# Patient Record
Sex: Female | Born: 1950
Health system: Southern US, Community
[De-identification: ages and names within clinical notes are randomized; demographics above are authoritative.]

## PROBLEM LIST (undated history)

## (undated) DIAGNOSIS — F419 Anxiety disorder, unspecified: Secondary | ICD-10-CM

## (undated) DIAGNOSIS — I10 Essential (primary) hypertension: Secondary | ICD-10-CM

## (undated) DIAGNOSIS — E119 Type 2 diabetes mellitus without complications: Secondary | ICD-10-CM

## (undated) DIAGNOSIS — L409 Psoriasis, unspecified: Secondary | ICD-10-CM

## (undated) DIAGNOSIS — A630 Anogenital (venereal) warts: Secondary | ICD-10-CM

## (undated) HISTORY — DX: Psoriasis, unspecified: L40.9

## (undated) HISTORY — DX: Type 2 diabetes mellitus without complications: E11.9

## (undated) HISTORY — DX: Essential (primary) hypertension: I10

## (undated) HISTORY — PX: APPENDECTOMY: SHX54

## (undated) HISTORY — DX: Anogenital (venereal) warts: A63.0

## (undated) HISTORY — PX: OPEN REPAIR PERIARTICULAR FRACTURE / DISLOCATION ELBOW: SUR901

## (undated) HISTORY — DX: Anxiety disorder, unspecified: F41.9

## (undated) HISTORY — PX: BREAST SURGERY: SHX581

---

## 1984-03-27 HISTORY — PX: TUBAL LIGATION: SHX77

## 1988-03-27 HISTORY — PX: ABDOMINAL HYSTERECTOMY: SHX81

## 1992-03-27 HISTORY — PX: CERVICAL DISCECTOMY: SHX98

## 1998-08-23 ENCOUNTER — Encounter: Payer: Self-pay | Admitting: Orthopedic Surgery

## 1998-08-27 ENCOUNTER — Inpatient Hospital Stay (HOSPITAL_COMMUNITY): Admission: RE | Admit: 1998-08-27 | Discharge: 1998-08-29 | Payer: Self-pay | Admitting: Orthopedic Surgery

## 1998-08-27 ENCOUNTER — Encounter: Payer: Self-pay | Admitting: Orthopedic Surgery

## 1999-03-28 HISTORY — PX: INCISION / DEBRIDEMENT BONE ELBOW: SUR694

## 1999-12-07 ENCOUNTER — Other Ambulatory Visit: Admission: RE | Admit: 1999-12-07 | Discharge: 1999-12-07 | Payer: Self-pay | Admitting: Family Medicine

## 2000-12-11 ENCOUNTER — Other Ambulatory Visit: Admission: RE | Admit: 2000-12-11 | Discharge: 2000-12-11 | Payer: Self-pay | Admitting: Family Medicine

## 2001-12-25 ENCOUNTER — Other Ambulatory Visit: Admission: RE | Admit: 2001-12-25 | Discharge: 2001-12-25 | Payer: Self-pay | Admitting: Obstetrics and Gynecology

## 2002-09-24 DIAGNOSIS — C4491 Basal cell carcinoma of skin, unspecified: Secondary | ICD-10-CM

## 2002-09-24 HISTORY — DX: Basal cell carcinoma of skin, unspecified: C44.91

## 2002-12-29 ENCOUNTER — Other Ambulatory Visit: Admission: RE | Admit: 2002-12-29 | Discharge: 2002-12-29 | Payer: Self-pay | Admitting: Obstetrics and Gynecology

## 2003-05-20 DIAGNOSIS — D229 Melanocytic nevi, unspecified: Secondary | ICD-10-CM

## 2003-05-20 HISTORY — DX: Melanocytic nevi, unspecified: D22.9

## 2003-09-08 ENCOUNTER — Ambulatory Visit (HOSPITAL_COMMUNITY): Admission: RE | Admit: 2003-09-08 | Discharge: 2003-09-08 | Payer: Self-pay | Admitting: Gastroenterology

## 2003-09-08 ENCOUNTER — Encounter (INDEPENDENT_AMBULATORY_CARE_PROVIDER_SITE_OTHER): Payer: Self-pay | Admitting: Specialist

## 2003-09-15 ENCOUNTER — Encounter: Admission: RE | Admit: 2003-09-15 | Discharge: 2003-09-15 | Payer: Self-pay | Admitting: Gastroenterology

## 2005-04-11 ENCOUNTER — Other Ambulatory Visit: Admission: RE | Admit: 2005-04-11 | Discharge: 2005-04-11 | Payer: Self-pay | Admitting: Obstetrics & Gynecology

## 2006-05-29 ENCOUNTER — Other Ambulatory Visit: Admission: RE | Admit: 2006-05-29 | Discharge: 2006-05-29 | Payer: Self-pay | Admitting: Obstetrics and Gynecology

## 2008-12-07 ENCOUNTER — Encounter: Admission: RE | Admit: 2008-12-07 | Discharge: 2008-12-07 | Payer: Self-pay | Admitting: Gastroenterology

## 2009-02-24 LAB — HM COLONOSCOPY

## 2009-06-21 ENCOUNTER — Encounter: Admission: RE | Admit: 2009-06-21 | Discharge: 2009-06-21 | Payer: Self-pay | Admitting: Obstetrics and Gynecology

## 2009-07-22 ENCOUNTER — Ambulatory Visit (HOSPITAL_COMMUNITY): Admission: RE | Admit: 2009-07-22 | Discharge: 2009-07-22 | Payer: Self-pay | Admitting: Orthopedic Surgery

## 2009-07-25 HISTORY — PX: SHOULDER DEBRIDEMENT: SHX1052

## 2010-04-17 ENCOUNTER — Encounter: Payer: Self-pay | Admitting: Neurological Surgery

## 2010-04-26 LAB — HM DEXA SCAN: HM Dexa Scan: BORDERLINE

## 2010-06-14 LAB — BASIC METABOLIC PANEL
Calcium: 9.5 mg/dL (ref 8.4–10.5)
Chloride: 108 mEq/L (ref 96–112)
GFR calc Af Amer: >60 mL/min (ref >60)
Potassium: 3.7 mEq/L (ref 3.5–5.1)
Sodium: 142 mEq/L (ref 135–145)

## 2010-08-12 NOTE — Op Note (Signed)
NAME:  Jacqueline Cox, Jacqueline Cox [facility]                   ACCOUNT NO.:  [id number]   MEDICAL RECORD NO.:  [id number]                   PATIENT TYPE:  AMB   LOCATION:  ENDO                                 FACILITY:  [facility]   PHYSICIAN:  Petra Kuba, M.D.                 DATE OF BIRTH:  May 22, 1950   DATE OF PROCEDURE:  09/08/2003  DATE OF DISCHARGE:                                 OPERATIVE REPORT   PROCEDURE:  Colonoscopy.   INDICATIONS:  Anemia, due for colon screening.   Consent was signed after the risks, benefits, methods and options were  thoroughly discussed in the office.   MEDICATIONS:  Demerol 80 mg, Versed 8 mg.   DESCRIPTION OF PROCEDURE:  Rectal inspection was pertinent for small  internal hemorrhoids.  Digital rectal exam was negative.  The video  pediatric adjustable colonoscope was inserted and it was advanced to the  level of the proximal hepatic flexure.  Unfortunately, there was significant  looping at this juncture and despite rolling her on her back and rolling her  on her right side and various abdominal pressures, we were unable to advance  any further.  On slow withdrawal, other than some tiny probable hyperplastic-  appearing rectal polyps which were not biopsied, no other abnormalities were  seen.  Anal and rectal pull through on retroflexion confirmed some small  hemorrhoids.  The prep was adequate.  We elected to insert the regular scope  and again it was easily able to be advanced to roughly the same exact area  but again despite altering positions and various abdominal pressures, could  not advance this scope any further due to looping.  The scope was then  slowly withdrawn and again no additional findings were seen.  The air was  suctioned, the scope removed, the patient tolerated the procedure well and  there was no obvious or immediate complication.   ENDOSCOPIC DIAGNOSES:  1. Internal and external hemorrhoids.  2. Few tiny hyperplastic-appearing rectal  polyps not biopsied secondary to     get a barium enema to rule out significant right-sided lesions.  3. Otherwise within normal limits to approximately the hepatic flexure using     both the pediatric adjustable and regular scope, unable to advance     secondary to looping.   PLAN:  Air contrast barium enema and continue workup with an EGD for now.                                               Petra Kuba, M.D.   MEM/MEDQ  D:  09/08/2003  T:  09/08/2003  Job:  [id number]   cc:   Ernestina Penna, M.D.  [address] [place]  [place] [address]  Fax: [phone]

## 2010-08-12 NOTE — Op Note (Signed)
NAME:  Jacqueline Cox, Jacqueline Cox [facility]                   ACCOUNT NO.:  [id number]   MEDICAL RECORD NO.:  [id number]                   PATIENT TYPE:  AMB   LOCATION:  ENDO                                 FACILITY:  [facility]   PHYSICIAN:  Petra Kuba, M.D.                 DATE OF BIRTH:  1950/07/12   DATE OF PROCEDURE:  09/08/2003  DATE OF DISCHARGE:                                 OPERATIVE REPORT   PROCEDURE:  Esophagogastroduodenoscopy.   INDICATIONS FOR PROCEDURE:  Longstanding upper tract symptoms, some upper  abdominal pain radiating to her back.   Consent was signed after risks, benefits, methods, and options were  thoroughly discussed in the office.   ADDITIONAL MEDICINES FOR THIS PROCEDURE:  Demerol 20, Versed 2.   DESCRIPTION OF PROCEDURE:  The video endoscope was inserted by direct  vision.  The esophagus was normal. In the distal esophagus was a small to  medium sized hiatal hernia with a widely patent ring. There was no signs of  Barrett's or significant esophagitis.  The scope passed into the stomach and  advanced through a normal antrum and normal pylorus into the duodenal bulb  pertinent for some minimal bulbitis and around the C loop to a normal second  portion of the duodenum. The scope was slowly withdrawn back to the bulb and  a good look there ruled out abnormalities in that location except for some  minimal inflammation. The scope was withdrawn back to the stomach and  retroflexed.  High in the cardia, the hiatal hernia was confirmed.  The  fundus, angularis, lesser and greater curve were normal except for some  minimal inflammation. Straight visualization confirmed the minimal  gastritis, ruled out any additional findings.  Two biopsies of the antrum  through the proximal stomach were obtained to rule out helicobacter, air was  suctioned, scope slowly withdrawn. Again a good look at the hiatal hernia  pouch and the esophagus confirmed the above findings. The scope was  removed.  The patient tolerated the procedure well. There was no obvious or immediate  complications.   ENDOSCOPIC DIAGNOSIS:  1. Small hiatal hernia with a widely patent proximal ring.  2. Minimal gastritis, antritis status post biopsy, rule out helicobacter.  3. Minimal bulbitis.  4. Otherwise normal esophagogastroduodenoscopy.   PLAN:  Await pathology, continue pump inhibitors.  Okay to use b.i.d. on a  bad day and get an ultrasound next to make sure gallbladder is not playing a  role with her symptoms.                                               Petra Kuba, M.D.    MEM/MEDQ  D:  09/08/2003  T:  09/08/2003  Job:  [id number]   cc:   Ernestina Penna, M.D.  [address] [place]  [place] [address]  Fax: [phone]

## 2011-06-20 ENCOUNTER — Other Ambulatory Visit: Payer: Self-pay | Admitting: Physician Assistant

## 2011-06-20 DIAGNOSIS — C4491 Basal cell carcinoma of skin, unspecified: Secondary | ICD-10-CM

## 2011-06-20 HISTORY — DX: Basal cell carcinoma of skin, unspecified: C44.91

## 2011-07-26 DIAGNOSIS — E119 Type 2 diabetes mellitus without complications: Secondary | ICD-10-CM

## 2011-07-26 HISTORY — DX: Type 2 diabetes mellitus without complications: E11.9

## 2011-08-15 LAB — HM MAMMOGRAPHY: HM Mammogram: NORMAL

## 2012-06-11 DIAGNOSIS — I10 Essential (primary) hypertension: Secondary | ICD-10-CM | POA: Insufficient documentation

## 2012-06-11 DIAGNOSIS — E785 Hyperlipidemia, unspecified: Secondary | ICD-10-CM | POA: Insufficient documentation

## 2012-06-11 DIAGNOSIS — E1169 Type 2 diabetes mellitus with other specified complication: Secondary | ICD-10-CM | POA: Insufficient documentation

## 2012-06-11 DIAGNOSIS — E119 Type 2 diabetes mellitus without complications: Secondary | ICD-10-CM | POA: Insufficient documentation

## 2012-07-16 ENCOUNTER — Ambulatory Visit: Payer: Self-pay | Admitting: Nurse Practitioner

## 2012-07-16 ENCOUNTER — Encounter: Payer: Self-pay | Admitting: *Deleted

## 2012-07-18 ENCOUNTER — Encounter: Payer: Self-pay | Admitting: Nurse Practitioner

## 2012-07-18 ENCOUNTER — Ambulatory Visit (INDEPENDENT_AMBULATORY_CARE_PROVIDER_SITE_OTHER): Payer: BC Managed Care – PPO | Admitting: Nurse Practitioner

## 2012-07-18 VITALS — BP 126/72 | HR 76 | Ht 65.0 in | Wt 152.4 lb

## 2012-07-18 DIAGNOSIS — Z01419 Encounter for gynecological examination (general) (routine) without abnormal findings: Secondary | ICD-10-CM

## 2012-07-18 DIAGNOSIS — Z0000 Encounter for general adult medical examination without abnormal findings: Secondary | ICD-10-CM

## 2012-07-18 LAB — POCT URINALYSIS DIPSTICK
Urobilinogen, UA: NEGATIVE
pH, UA: 6.5

## 2012-07-18 NOTE — Patient Instructions (Addendum)

## 2012-07-18 NOTE — Progress Notes (Signed)
62 y.o. Married Caucasian Fe here for annual exam.  Some vaso symptoms but are very tolerable. Not sexually active.  Husband with prostate cancer and doing well. About 6 months ago, he also had back surgery and is in PT.   No LMP recorded.          Sexually active: no  The current method of family planning is status post hysterectomy.    Exercising: yes  Home exercise routine includes walking 3 hrs per week. Smoker:  no  Health Maintenance: Pap:  05/29/2006 normal MMG: May 2013 Colonoscopy:  2011 BMD:   2013 normal TDaP:  12/15/2010 Labs: PCP does blood work      Past Medical History  Diagnosis Date  . Anxiety   . Hypertension     Past Surgical History  Procedure Laterality Date  . Abdominal hysterectomy  1990    TAH    Current Outpatient Prescriptions  Medication Sig Dispense Refill  . amLODipine (NORVASC) 5 MG tablet Take 5 mg by mouth daily.      Marland Kitchen aspirin 81 MG tablet Take 81 mg by mouth daily.      Marland Kitchen atorvastatin (LIPITOR) 20 MG tablet Take 20 mg by mouth daily.      . busPIRone (BUSPAR) 7.5 MG tablet Take 7.5 mg by mouth 3 (three) times daily.      . Calcium Carbonate-Vit D-Min (CALCIUM 1200 PO) Take by mouth.      . cetirizine (ZYRTEC) 10 MG tablet Take 10 mg by mouth daily.      . cholecalciferol (VITAMIN D) 1000 UNITS tablet Take 1,000 Units by mouth daily.      . lansoprazole (PREVACID) 30 MG capsule Take 30 mg by mouth daily.      . Multiple Vitamin (MULTIVITAMIN) capsule Take 1 capsule by mouth daily.      Marland Kitchen omeprazole (PRILOSEC) 20 MG capsule Take 20 mg by mouth daily.       No current facility-administered medications for this visit.    Family History  Problem Relation Age of Onset  . Hypertension Mother   . Brain cancer Mother   . Hypertension Father   . Diabetes Father   . Hypertension Sister   . Diabetes Sister   . Hypertension Brother   . Lung cancer Brother   . Diabetes Brother     ROS:  Pertinent items are noted in HPI.  Otherwise, a  comprehensive ROS was negative.  Exam:   There were no vitals taken for this visit.       Ht Readings from Last 3 Encounters:  No data found for Ht    General appearance: alert, cooperative and appears stated age Head: Normocephalic, without obvious abnormality, atraumatic Neck: no adenopathy, supple, symmetrical, trachea midline and thyroid normal to inspection and palpation Lungs: clear to auscultation bilaterally Breasts: normal appearance, no masses or tenderness, positive findings: scar left breast at 3:00 position Heart: regular rate and rhythm Abdomen: soft, non-tender; no masses,  no organomegaly Extremities: extremities normal, atraumatic, no cyanosis or edema Skin: Skin color, texture, turgor normal. No rashes or lesions Lymph nodes: Cervical, supraclavicular, and axillary nodes normal. No abnormal inguinal nodes palpated Neurologic: Grossly normal   Pelvic: External genitalia:  no lesions              Urethra:  normal appearing urethra with no masses, tenderness or lesions              Bartholin's and Skene's: normal  Vagina: normal appearing vagina with normal color and discharge, no lesions              Cervix: absent              Pap taken: no Bimanual Exam:  Uterus:  uterus absent              Adnexa: no mass, fullness, tenderness               Rectovaginal: Confirms               Anus:  normal sphincter tone, no lesions  A:  Well Woman with normal exam  S /P TAH secondary to AUB and abnormal pap 1990 (over 20 yrs)  P:   Mammogram due 07/2012  pap smear not indicated  return annually or prn  An After Visit Summary was printed and given to the patient.

## 2012-07-20 ENCOUNTER — Emergency Department (HOSPITAL_COMMUNITY): Payer: BC Managed Care – PPO

## 2012-07-20 ENCOUNTER — Encounter (HOSPITAL_COMMUNITY): Payer: Self-pay

## 2012-07-20 ENCOUNTER — Emergency Department (HOSPITAL_COMMUNITY)
Admission: EM | Admit: 2012-07-20 | Discharge: 2012-07-21 | Disposition: A | Discharge status: Home / Self Care | Payer: BC Managed Care – PPO | Attending: Emergency Medicine | Admitting: Emergency Medicine

## 2012-07-20 DIAGNOSIS — E119 Type 2 diabetes mellitus without complications: Secondary | ICD-10-CM | POA: Insufficient documentation

## 2012-07-20 DIAGNOSIS — I10 Essential (primary) hypertension: Secondary | ICD-10-CM | POA: Insufficient documentation

## 2012-07-20 DIAGNOSIS — R110 Nausea: Secondary | ICD-10-CM | POA: Insufficient documentation

## 2012-07-20 DIAGNOSIS — Z8709 Personal history of other diseases of the respiratory system: Secondary | ICD-10-CM | POA: Insufficient documentation

## 2012-07-20 DIAGNOSIS — F411 Generalized anxiety disorder: Secondary | ICD-10-CM | POA: Insufficient documentation

## 2012-07-20 DIAGNOSIS — Z79899 Other long term (current) drug therapy: Secondary | ICD-10-CM | POA: Insufficient documentation

## 2012-07-20 DIAGNOSIS — Z7982 Long term (current) use of aspirin: Secondary | ICD-10-CM | POA: Insufficient documentation

## 2012-07-20 DIAGNOSIS — R109 Unspecified abdominal pain: Secondary | ICD-10-CM

## 2012-07-20 DIAGNOSIS — Z872 Personal history of diseases of the skin and subcutaneous tissue: Secondary | ICD-10-CM | POA: Insufficient documentation

## 2012-07-20 LAB — URINALYSIS, ROUTINE W REFLEX MICROSCOPIC
Bilirubin Urine: NEGATIVE
Leukocytes, UA: NEGATIVE
Nitrite: NEGATIVE
Specific Gravity, Urine: 1.017 (ref 1.005–1.030)
Urobilinogen, UA: 0.2 mg/dL (ref 0.0–1.0)
pH: 6 (ref 5.0–8.0)

## 2012-07-20 LAB — COMPREHENSIVE METABOLIC PANEL
ALT: 73 U/L — ABNORMAL HIGH (ref 0–35)
Alkaline Phosphatase: 80 U/L (ref 39–117)
BUN: 12 mg/dL (ref 6–23)
CO2: 24 mEq/L (ref 19–32)
Chloride: 103 mEq/L (ref 96–112)
GFR calc Af Amer: >90 mL/min (ref >90)
Glucose, Bld: 116 mg/dL — ABNORMAL HIGH (ref 70–99)
Potassium: 3.5 mEq/L (ref 3.5–5.1)
Sodium: 138 mEq/L (ref 135–145)
Total Bilirubin: 0.3 mg/dL (ref 0.3–1.2)

## 2012-07-20 LAB — CBC WITH DIFFERENTIAL/PLATELET
HCT: 40.3 % (ref 36.0–46.0)
Lymphs Abs: 1.8 10*3/uL (ref 0.7–4.0)
MCHC: 33 g/dL (ref 30.0–36.0)
MCV: 84.7 fL (ref 78.0–100.0)
Monocytes Relative: 15 % — ABNORMAL HIGH (ref 3–12)
Platelets: 315 10*3/uL (ref 150–400)
RDW: 14.1 % (ref 11.5–15.5)

## 2012-07-20 LAB — URINE MICROSCOPIC-ADD ON

## 2012-07-20 LAB — LIPASE, BLOOD: Lipase: 54 U/L (ref 11–59)

## 2012-07-20 MED ORDER — SODIUM CHLORIDE 0.9 % IV SOLN
INTRAVENOUS | Status: DC
  Administered 2012-07-20: 19:00:00 via INTRAVENOUS

## 2012-07-20 MED ORDER — IOHEXOL 300 MG/ML  SOLN: 100.0000 mL | Freq: Once | INTRAMUSCULAR | Status: AC | PRN

## 2012-07-20 MED ORDER — HYDROMORPHONE HCL PF 1 MG/ML IJ SOLN
0.5000 mg | Freq: Once | INTRAMUSCULAR | Status: AC
  Administered 2012-07-20: 0.5 mg via INTRAVENOUS
  Filled 2012-07-20: qty 1

## 2012-07-20 MED ORDER — IOHEXOL 300 MG/ML  SOLN
50.0000 mL | Freq: Once | INTRAMUSCULAR | Status: AC | PRN
  Administered 2012-07-20: 50 mL via ORAL

## 2012-07-20 MED ORDER — ONDANSETRON HCL 4 MG/2ML IJ SOLN
4.0000 mg | Freq: Once | INTRAMUSCULAR | Status: AC
  Administered 2012-07-20: 4 mg via INTRAVENOUS
  Filled 2012-07-20: qty 2

## 2012-07-20 NOTE — ED Notes (Signed)
She c/o upper abd. Discomfort with nausea and anorexia since this Wed.  She states she had two b.m.'s today; the first was "small" and formed, the second was "a lot of liquid".  She denies seeing any blood in her stool.  She also mentions having some sinus congestion; for which she was seen at an [facility] where she works; and they prescribed penicillin "for my sinuses" yesterday.

## 2012-07-20 NOTE — ED Provider Notes (Signed)
History     CSN: [id number]  Arrival date & time 07/20/12  1734   First MD Initiated Contact with Patient 07/20/12 1739      Chief Complaint  Patient presents with  . Abdominal Pain    (Consider location/radiation/quality/duration/timing/severity/associated sxs/prior treatment) Patient is a 62 y.o. female presenting with abdominal pain. The history is provided by the patient.  Abdominal Pain  patient here complaining of right upper quadrant and epigastric pain since Wednesday. Some nausea but no vomiting or fever prior to arrival. Started on Augmentin for a sinus infection. Symptoms are worse with movement and characterized as sharp and crampy. Did have a loose stool as well but denies any blood in stool. No prior history of same. Questionable association with food. No treatment used for this prior to arrival.  Past Medical History  Diagnosis Date  . Anxiety   . Diabetes mellitus without complication 07/2011    type 2  . Hypertension late 15s  . Psoriasis age 41    hair line, pubic, hands    Past Surgical History  Procedure Laterality Date  . Abdominal hysterectomy  1990    TAH, AUB, ? endometriosis, cervical dysplasia  . Tubal ligation  1986  . Cervical discectomy  1994    C 4-5  . Open repair periarticular fracture / dislocation elbow Right 2000 for 3 surgeries    secondary to a fall  . Incision / debridement bone elbow Right 2001  . Shoulder debridement Left 07/2009    bone spur  . Breast surgery Left 1993 & 1995    benign BX    Family History  Problem Relation Age of Onset  . Hypertension Mother   . Brain cancer Mother 92  . Hypertension Father   . Diabetes Father   . Hypertension Sister   . Diabetes Sister   . Hypertension Brother   . Lung cancer Brother 46  . Diabetes Brother     History  Substance Use Topics  . Smoking status: Never Smoker   . Smokeless tobacco: Never Used  . Alcohol Use: No    OB History   Grav Para Term Preterm Abortions TAB  SAB Ect Mult Living   2 2 2              Review of Systems  Gastrointestinal: Positive for abdominal pain.  All other systems reviewed and are negative.    Allergies  Codeine and Paxil  Home Medications   Current Outpatient Rx  Name  Route  Sig  Dispense  Refill  . amLODipine (NORVASC) 5 MG tablet   Oral   Take 5 mg by mouth daily.         Marland Kitchen aspirin 81 MG tablet   Oral   Take 81 mg by mouth daily.         Marland Kitchen atorvastatin (LIPITOR) 20 MG tablet   Oral   Take 20 mg by mouth daily.         . busPIRone (BUSPAR) 7.5 MG tablet   Oral   Take 7.5 mg by mouth 3 (three) times daily.         . cetirizine (ZYRTEC) 10 MG tablet   Oral   Take 10 mg by mouth daily.         . cholecalciferol (VITAMIN D) 1000 UNITS tablet   Oral   Take 1,000 Units by mouth daily.         . metFORMIN (GLUCOPHAGE) 500 MG tablet   Oral  Take 500 mg by mouth 2 (two) times daily with a meal.         . Multiple Vitamin (MULTIVITAMIN) capsule   Oral   Take 1 capsule by mouth daily.         Marland Kitchen omeprazole (PRILOSEC) 20 MG capsule   Oral   Take 20 mg by mouth daily.           BP 138/80  Temp(Src) 99 F (37.2 C) (Oral)  SpO2 100%  Physical Exam  Nursing note and vitals reviewed. Constitutional: She is oriented to person, place, and time. She appears well-developed and well-nourished.  Non-toxic appearance. No distress.  HENT:  Head: Normocephalic and atraumatic.  Eyes: Conjunctivae, EOM and lids are normal. Pupils are equal, round, and reactive to light.  Neck: Normal range of motion. Neck supple. No tracheal deviation present. No mass present.  Cardiovascular: Normal rate, regular rhythm and normal heart sounds.  Exam reveals no gallop.   No murmur heard. Pulmonary/Chest: Effort normal and breath sounds normal. No stridor. No respiratory distress. She has no decreased breath sounds. She has no wheezes. She has no rhonchi. She has no rales.  Abdominal: Soft. Normal  appearance and bowel sounds are normal. She exhibits no distension. There is tenderness in the right upper quadrant, right lower quadrant and epigastric area. There is no rigidity, no rebound, no guarding and no CVA tenderness.  Musculoskeletal: Normal range of motion. She exhibits no edema and no tenderness.  Neurological: She is alert and oriented to person, place, and time. She has normal strength. No cranial nerve deficit or sensory deficit. GCS eye subscore is 4. GCS verbal subscore is 5. GCS motor subscore is 6.  Skin: Skin is warm and dry. No abrasion and no rash noted.  Psychiatric: She has a normal mood and affect. Her speech is normal and behavior is normal.    ED Course  Procedures (including critical care time)  Labs Reviewed  CBC WITH DIFFERENTIAL  COMPREHENSIVE METABOLIC PANEL  LIPASE, BLOOD  URINALYSIS, ROUTINE W REFLEX MICROSCOPIC   No results found.   No diagnosis found.    MDM  Pt given iv fluids and pain meds, abd [place] neg for stones, will order abd ct, dr. Dierdre Highman to f/u        Toy Baker, MD 07/20/12 2204

## 2012-07-20 NOTE — ED Notes (Signed)
Pt notified that urine is needed

## 2012-07-21 MED ORDER — IOHEXOL 300 MG/ML  SOLN
100.0000 mL | Freq: Once | INTRAMUSCULAR | Status: AC | PRN
  Administered 2012-07-21: 100 mL via INTRAVENOUS

## 2012-07-21 MED ORDER — ONDANSETRON HCL 4 MG PO TABS: 4.0000 mg | ORAL_TABLET | Freq: Four times a day (QID) | ORAL | Status: DC

## 2012-07-21 MED ORDER — HYDROCODONE-ACETAMINOPHEN 5-325 MG PO TABS: 2.0000 | ORAL_TABLET | ORAL | Status: DC | PRN

## 2012-07-21 NOTE — Progress Notes (Signed)
Encounter reviewed by Dr. Yessika Otte Silva.  

## 2012-07-21 NOTE — ED Provider Notes (Signed)
PT evaluated 12:48 AM after IV Dilaudid x 2 and zofran/ IVFs. Ct reviewed as below. On recheck pain improved. No acute ABD. Plan f/u GI. Clear liquids diet 24 hours then bland foods. Return precautions provided   Results for orders placed during the hospital encounter of 07/20/12  CBC WITH DIFFERENTIAL      Result Value Range   WBC 6.0  4.0 - 10.5 K/uL   RBC 4.76  3.87 - 5.11 MIL/uL   Hemoglobin 13.3  12.0 - 15.0 g/dL   HCT 16.1  09.6 - 04.5 %   MCV 84.7  78.0 - 100.0 fL   MCH 27.9  26.0 - 34.0 pg   MCHC 33.0  30.0 - 36.0 g/dL   RDW 40.9  81.1 - 91.4 %   Platelets 315  150 - 400 K/uL   Neutrophils Relative 53  43 - 77 %   Neutro Abs 3.2  1.7 - 7.7 K/uL   Lymphocytes Relative 30  12 - 46 %   Lymphs Abs 1.8  0.7 - 4.0 K/uL   Monocytes Relative 15 (*) 3 - 12 %   Monocytes Absolute 0.9  0.1 - 1.0 K/uL   Eosinophils Relative 1  0 - 5 %   Eosinophils Absolute 0.1  0.0 - 0.7 K/uL   Basophils Relative 0  0 - 1 %   Basophils Absolute 0.0  0.0 - 0.1 K/uL  COMPREHENSIVE METABOLIC PANEL      Result Value Range   Sodium 138  135 - 145 mEq/L   Potassium 3.5  3.5 - 5.1 mEq/L   Chloride 103  96 - 112 mEq/L   CO2 24  19 - 32 mEq/L   Glucose, Bld 116 (*) 70 - 99 mg/dL   BUN 12  6 - 23 mg/dL   Creatinine, Ser 7.82  0.50 - 1.10 mg/dL   Calcium 9.6  8.4 - 95.6 mg/dL   Total Protein 7.5  6.0 - 8.3 g/dL   Albumin 3.9  3.5 - 5.2 g/dL   AST 65 (*) 0 - 37 U/L   ALT 73 (*) 0 - 35 U/L   Alkaline Phosphatase 80  39 - 117 U/L   Total Bilirubin 0.3  0.3 - 1.2 mg/dL   GFR calc non Af Amer >90  >90 mL/min   GFR calc Af Amer >90  >90 mL/min  LIPASE, BLOOD      Result Value Range   Lipase 54  11 - 59 U/L  URINALYSIS, ROUTINE W REFLEX MICROSCOPIC      Result Value Range   Color, Urine YELLOW  YELLOW   APPearance CLEAR  CLEAR   Specific Gravity, Urine 1.017  1.005 - 1.030   pH 6.0  5.0 - 8.0   Glucose, UA NEGATIVE  NEGATIVE mg/dL   Hgb urine dipstick SMALL (*) NEGATIVE   Bilirubin Urine NEGATIVE   NEGATIVE   Ketones, ur NEGATIVE  NEGATIVE mg/dL   Protein, ur NEGATIVE  NEGATIVE mg/dL   Urobilinogen, UA 0.2  0.0 - 1.0 mg/dL   Nitrite NEGATIVE  NEGATIVE   Leukocytes, UA NEGATIVE  NEGATIVE  URINE MICROSCOPIC-ADD ON      Result Value Range   Squamous Epithelial / LPF RARE  RARE   RBC / HPF 0-2  <3 RBC/hpf   Bacteria, UA RARE  RARE   US Abdomen Complete  07/20/2012  *RADIOLOGY REPORT*  Clinical Data:  Right upper quadrant, epigastric, and lower abdominal pain radiating to the back.  Nausea and  vomiting.  COMPLETE ABDOMINAL ULTRASOUND  Comparison:  CT colonography 12/07/2008  Findings:  Gallbladder:  Focal echogenic structure in the wall of the gallbladder with ring down artifact consistent with cholesterolosis.  No evidence of gallbladder wall thickening, edema, stone, or sludge.  Common bile duct:  Normal caliber with diameter measured at 3 mm.  Liver:  No focal lesion identified.  Within normal limits in parenchymal echogenicity.  IVC:  Limited visualization due to overlying bowel gas.  Visualized segments are unremarkable.  Pancreas:  Portions of the head and body of the pancreas are unremarkable.  Limited visualization of detail due to overlying bowel gas.  Spleen:  Spleen length measures 4.5 cm.  Normal parenchymal echotexture.  Right Kidney:  Right kidney measures 11.1 cm length.  No hydronephrosis.  Left Kidney:  Left kidney measures 12.3 cm length.  No hydronephrosis.  Abdominal aorta:  Limited segmental visualization due to overlying bowel gas and visualized segments are not aneurysmal.  IMPRESSION: Changes of cholesterolosis of the gallbladder.  No evidence of acute cholecystitis.   Original Report Authenticated By: Burman Nieves, M.D.    Ct Abdomen Pelvis W Contrast  07/21/2012  *RADIOLOGY REPORT*  Clinical Data: Right upper quadrant and epigastric pain since Wednesday.  CT ABDOMEN AND PELVIS WITH CONTRAST  Technique:  Multidetector CT imaging of the abdomen and pelvis was performed  following the standard protocol during bolus administration of intravenous contrast.  Contrast: OMNIPAQUE IOHEXOL 300 MG/ML  SOLN  Comparison: CT colonography 12/07/2008  Findings: Mild dependent changes in the lung bases.  Small esophageal hiatal hernia.  Mild diffuse low attenuation change throughout the liver suggesting fatty infiltration.  No focal lesions are demonstrated.  The spleen, gallbladder, pancreas, adrenal glands, kidneys, inferior vena cava, stomach, small bowel, and colon are unremarkable. Scattered calcification in the abdominal aorta without aneurysm. No free air or free fluid in the abdomen.  Pelvis:  Surgical absence of the uterus.  No abnormal adnexal masses.  The bladder is decompressed.  No free or loculated pelvic fluid collections.  No evidence of diverticulitis.  The appendix is not identified.  No significant pelvic lymphadenopathy. Spondylolysis with mild to moderate spondylolisthesis of L5 on the sacrum.  Degenerative changes in the lumbar spine.  No destructive bone lesions are appreciated.  IMPRESSION: Small esophageal hiatal hernia.  Diffuse fatty infiltration of the liver.  Spondylolysis and spondylolisthesis of L5 S1.   Original Report Authenticated By: Burman Nieves, M.D.       Sunnie Nielsen, MD 07/21/12 0100

## 2012-07-21 NOTE — ED Notes (Signed)
Patient is alert and oriented x3.  She was given DC instructions and follow up visit instructions.  Patient gave verbal understanding. She was DC ambulatory under her own power to home.  V/S stable.  He was not showing any signs of distress on DC 

## 2012-07-26 ENCOUNTER — Other Ambulatory Visit (HOSPITAL_COMMUNITY): Payer: Self-pay | Admitting: Gastroenterology

## 2012-07-26 DIAGNOSIS — R109 Unspecified abdominal pain: Secondary | ICD-10-CM

## 2012-07-31 ENCOUNTER — Other Ambulatory Visit: Payer: Self-pay

## 2012-08-07 ENCOUNTER — Encounter (HOSPITAL_COMMUNITY)
Admission: RE | Admit: 2012-08-07 | Discharge: 2012-08-07 | Disposition: A | Discharge status: Home / Self Care | Payer: BC Managed Care – PPO | Source: Ambulatory Visit | Attending: Gastroenterology | Admitting: Gastroenterology

## 2012-08-07 DIAGNOSIS — R109 Unspecified abdominal pain: Secondary | ICD-10-CM | POA: Insufficient documentation

## 2012-08-07 MED ORDER — SINCALIDE 5 MCG IJ SOLR: 0.0200 ug/kg | Freq: Once | INTRAMUSCULAR | Status: AC

## 2012-08-07 MED ORDER — TECHNETIUM TC 99M MEBROFENIN IV KIT
5.0000 | PACK | Freq: Once | INTRAVENOUS | Status: AC | PRN
  Administered 2012-08-07: 5 via INTRAVENOUS

## 2012-08-07 MED ORDER — SINCALIDE 5 MCG IJ SOLR
INTRAMUSCULAR | Status: AC
  Administered 2012-08-07: 1.35 ug via INTRAVENOUS
  Filled 2012-08-07: qty 5

## 2012-10-10 ENCOUNTER — Other Ambulatory Visit: Payer: Self-pay | Admitting: Physician Assistant

## 2012-11-11 ENCOUNTER — Other Ambulatory Visit: Payer: Self-pay

## 2012-11-11 NOTE — Telephone Encounter (Signed)
x

## 2013-07-22 ENCOUNTER — Ambulatory Visit: Payer: BC Managed Care – PPO | Admitting: Nurse Practitioner

## 2013-08-19 ENCOUNTER — Ambulatory Visit (INDEPENDENT_AMBULATORY_CARE_PROVIDER_SITE_OTHER): Payer: BC Managed Care – PPO | Admitting: Nurse Practitioner

## 2013-08-19 ENCOUNTER — Encounter: Payer: Self-pay | Admitting: Nurse Practitioner

## 2013-08-19 VITALS — BP 130/76 | HR 72 | Ht 64.5 in | Wt 152.0 lb

## 2013-08-19 DIAGNOSIS — Z01419 Encounter for gynecological examination (general) (routine) without abnormal findings: Secondary | ICD-10-CM

## 2013-08-19 DIAGNOSIS — Z0000 Encounter for general adult medical examination without abnormal findings: Secondary | ICD-10-CM

## 2013-08-19 LAB — POCT URINALYSIS DIPSTICK
BILIRUBIN UA: NEGATIVE
Blood, UA: NEGATIVE
GLUCOSE UA: NEGATIVE
KETONES UA: NEGATIVE
LEUKOCYTES UA: NEGATIVE
Nitrite, UA: NEGATIVE
Protein, UA: NEGATIVE
Urobilinogen, UA: NEGATIVE
pH, UA: 6

## 2013-08-19 NOTE — Progress Notes (Signed)
Patient ID: Jacqueline Cox, female   DOB: 1950-08-16, 63 y.o.   MRN: [id number] 63 y.o. G2P2000 Married Caucasian Fe here for annual exam.  Had low back pain secondary to OA and spondylodesis.  She took steroids po and this caused blood sugar to go up.   Last HBG AIC 7.0 07/30/13.  Husband doing well from the prostate cancer, he has now had 2 back surgeries.  No LMP recorded. Patient has had a hysterectomy.          Sexually active: no  The current method of family planning is status post hysterectomy.    Exercising: no  The patient does not participate in regular exercise at present. Smoker:  no  Health Maintenance: Pap: 05/29/2006 normal  MMG: April 2014 - normal  Colonoscopy: 2011  BMD: 2013 normal  TDaP: 12/15/2010  Labs:  HB:  PCP  Urine: negative    reports that she has never smoked. She has never used smokeless tobacco. She reports that she does not drink alcohol or use illicit drugs.  Past Medical History  Diagnosis Date  . Anxiety   . Diabetes mellitus without complication 0/3474    type 2  . Hypertension late 69s  . Psoriasis age 81    hair line, pubic, hands    Past Surgical History  Procedure Laterality Date  . Abdominal hysterectomy  1990    TAH, AUB, ? endometriosis, cervical dysplasia  . Tubal ligation  1986  . Cervical discectomy  1994    C 4-5  . Open repair periarticular fracture / dislocation elbow Right 2000 for 3 surgeries    secondary to a fall  . Incision / debridement bone elbow Right 2001  . Shoulder debridement Left 07/2009    bone spur  . Breast surgery Left 1993 & 1995    benign BX    Current Outpatient Prescriptions  Medication Sig Dispense Refill  . amLODipine (NORVASC) 5 MG tablet Take 5 mg by mouth daily.      Marland Kitchen aspirin 81 MG tablet Take 81 mg by mouth daily.      Marland Kitchen atorvastatin (LIPITOR) 20 MG tablet Take 20 mg by mouth daily.      . busPIRone (BUSPAR) 7.5 MG tablet Take 7.5 mg by mouth 2 (two) times daily.       . cetirizine (ZYRTEC) 10 MG  tablet Take 10 mg by mouth daily.      . cholecalciferol (VITAMIN D) 1000 UNITS tablet Take 2,000 Units by mouth daily.       Marland Kitchen HYDROcodone-acetaminophen (NORCO/VICODIN) 5-325 MG per tablet Take 2 tablets by mouth every 4 (four) hours as needed for pain.  6 tablet  0  . metFORMIN (GLUCOPHAGE) 500 MG tablet Take 500 mg by mouth 2 (two) times daily with a meal.      . Multiple Vitamin (MULTIVITAMIN) capsule Take 1 capsule by mouth daily.      Marland Kitchen omeprazole (PRILOSEC) 20 MG capsule Take 20 mg by mouth daily.      . tacrolimus (PROTOPIC) 0.1 % ointment Apply topically 2 (two) times daily.      . traMADol (ULTRAM) 50 MG tablet Take 1 tablet by mouth as needed.       No current facility-administered medications for this visit.    Family History  Problem Relation Age of Onset  . Hypertension Mother   . Brain cancer Mother 23  . Hypertension Father   . Diabetes Father   . Hypertension Sister   .  Diabetes Sister   . Hypertension Brother   . Lung cancer Brother 33  . Diabetes Brother     ROS:  Pertinent items are noted in HPI.  Otherwise, a comprehensive ROS was negative.  Exam:   BP 130/76  Pulse 72  Ht 5' 4.5" (1.638 m)  Wt 152 lb (68.947 kg)  BMI 25.70 kg/m2 Height: 5' 4.5" (163.8 cm)  Ht Readings from Last 3 Encounters:  08/19/13 5' 4.5" (1.638 m)  07/18/12 5\' 5"  (1.651 m)    General appearance: alert, cooperative and appears stated age Head: Normocephalic, without obvious abnormality, atraumatic Neck: no adenopathy, supple, symmetrical, trachea midline and thyroid normal to inspection and palpation Lungs: clear to auscultation bilaterally Breasts: normal appearance, no masses or tenderness Heart: regular rate and rhythm Abdomen: soft, non-tender; no masses,  no organomegaly Extremities: extremities normal, atraumatic, no cyanosis or edema Skin: Skin color, texture, turgor normal. No rashes or lesions Lymph nodes: Cervical, supraclavicular, and axillary nodes normal. No  abnormal inguinal nodes palpated Neurologic: Grossly normal   Pelvic: External genitalia:  no lesions              Urethra:  normal appearing urethra with no masses, tenderness or lesions              Bartholin's and Skene's: normal                 Vagina: normal appearing vagina with normal color and discharge, no lesions              Cervix: absent              Pap taken: no Bimanual Exam:  Uterus:  uterus absent              Adnexa: no mass, fullness, tenderness               Rectovaginal: Confirms               Anus:  normal sphincter tone, no lesions  A:  Well Woman with normal exam  S/P TAH secondary to AUB and abnormal pap 1990 (over 20 yrs)  P:   Reviewed health and wellness pertinent to exam  Pap smear not taken today  Mammogram is scheduled for June ([facility] for [facility] with Mikel Cella)  Counseled on breast self exam, adequate intake of calcium and vitamin D, diet and exercise, Kegel's exercises return annually or prn  An After Visit Summary was printed and given to the patient.

## 2013-08-19 NOTE — Patient Instructions (Signed)

## 2013-08-22 NOTE — Progress Notes (Signed)
Encounter reviewed by Dr. Glada Wickstrom Silva.  

## 2013-11-17 ENCOUNTER — Ambulatory Visit: Payer: BC Managed Care – PPO | Attending: Specialist | Admitting: Physical Therapy

## 2013-11-17 DIAGNOSIS — IMO0001 Reserved for inherently not codable concepts without codable children: Secondary | ICD-10-CM | POA: Insufficient documentation

## 2013-11-17 DIAGNOSIS — M545 Low back pain, unspecified: Secondary | ICD-10-CM | POA: Insufficient documentation

## 2013-11-17 DIAGNOSIS — R5381 Other malaise: Secondary | ICD-10-CM | POA: Insufficient documentation

## 2013-11-21 ENCOUNTER — Ambulatory Visit: Payer: BC Managed Care – PPO | Admitting: *Deleted

## 2013-11-21 DIAGNOSIS — IMO0001 Reserved for inherently not codable concepts without codable children: Secondary | ICD-10-CM | POA: Diagnosis not present

## 2013-11-26 ENCOUNTER — Ambulatory Visit: Payer: BC Managed Care – PPO | Attending: Specialist | Admitting: Physical Therapy

## 2013-11-26 DIAGNOSIS — I10 Essential (primary) hypertension: Secondary | ICD-10-CM | POA: Diagnosis not present

## 2013-11-26 DIAGNOSIS — M48061 Spinal stenosis, lumbar region without neurogenic claudication: Secondary | ICD-10-CM | POA: Insufficient documentation

## 2013-11-26 DIAGNOSIS — Q762 Congenital spondylolisthesis: Secondary | ICD-10-CM | POA: Insufficient documentation

## 2013-11-26 DIAGNOSIS — Z9889 Other specified postprocedural states: Secondary | ICD-10-CM | POA: Insufficient documentation

## 2013-11-26 DIAGNOSIS — M545 Low back pain, unspecified: Secondary | ICD-10-CM | POA: Insufficient documentation

## 2013-11-26 DIAGNOSIS — M51379 Other intervertebral disc degeneration, lumbosacral region without mention of lumbar back pain or lower extremity pain: Secondary | ICD-10-CM | POA: Insufficient documentation

## 2013-11-26 DIAGNOSIS — IMO0001 Reserved for inherently not codable concepts without codable children: Secondary | ICD-10-CM | POA: Diagnosis not present

## 2013-11-26 DIAGNOSIS — M5137 Other intervertebral disc degeneration, lumbosacral region: Secondary | ICD-10-CM | POA: Diagnosis not present

## 2013-11-26 DIAGNOSIS — R5381 Other malaise: Secondary | ICD-10-CM | POA: Insufficient documentation

## 2013-11-26 DIAGNOSIS — E119 Type 2 diabetes mellitus without complications: Secondary | ICD-10-CM | POA: Insufficient documentation

## 2013-12-03 ENCOUNTER — Ambulatory Visit: Payer: BC Managed Care – PPO | Admitting: Physical Therapy

## 2013-12-03 DIAGNOSIS — IMO0001 Reserved for inherently not codable concepts without codable children: Secondary | ICD-10-CM | POA: Diagnosis not present

## 2013-12-05 IMAGING — US US ABDOMEN COMPLETE
1 series · 13 of 25 positions shown · non-contrast
Comparison: CT colonography 12/07/2008

CLINICAL DATA: Right upper quadrant, epigastric, and lower
abdominal pain radiating to the back.  Nausea and vomiting.

COMPLETE ABDOMINAL ULTRASOUND

[Series 1: us abdomen complete · 0.30mm/px · 13 of 74 slices shown]
[im 1/74]
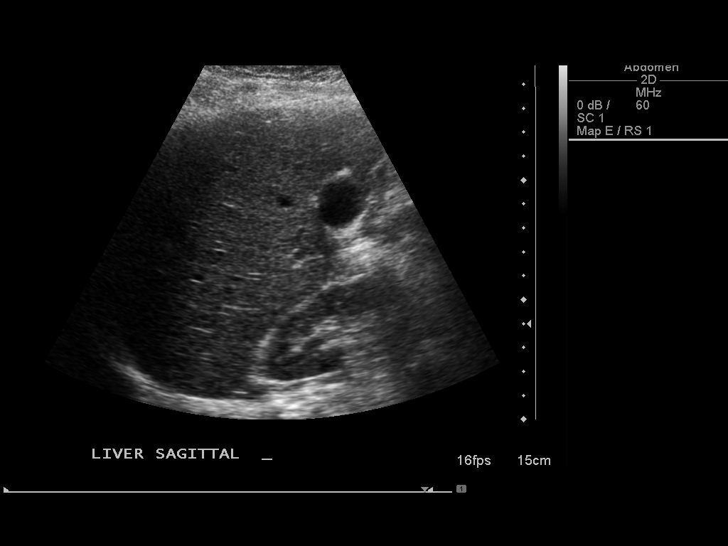
[im 7/74]
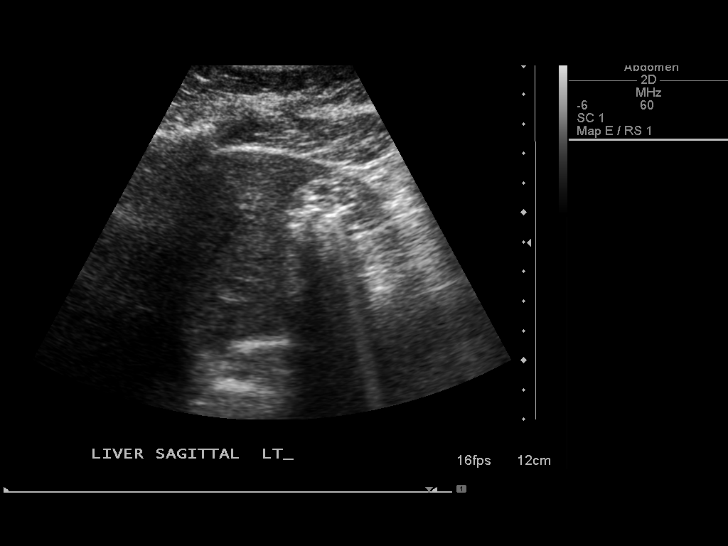
[im 13/74]
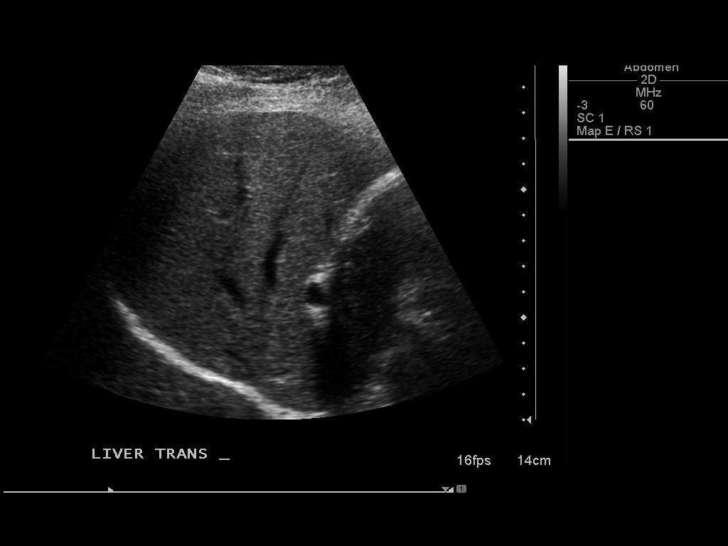
[im 19/74]
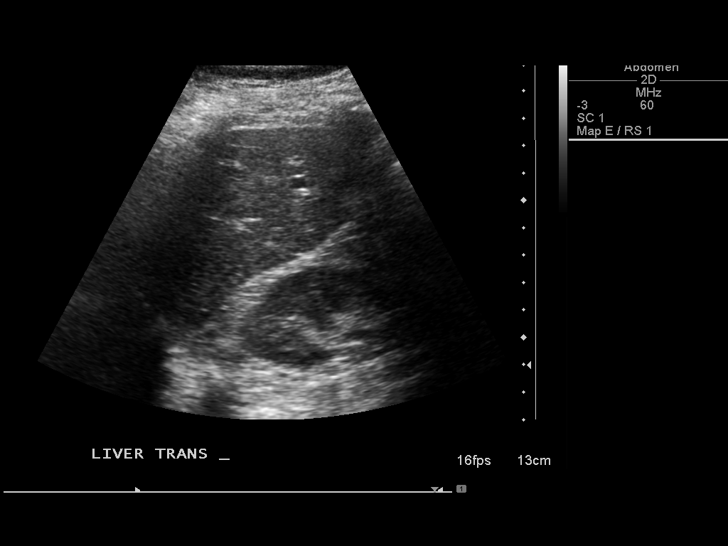
[im 25/74]
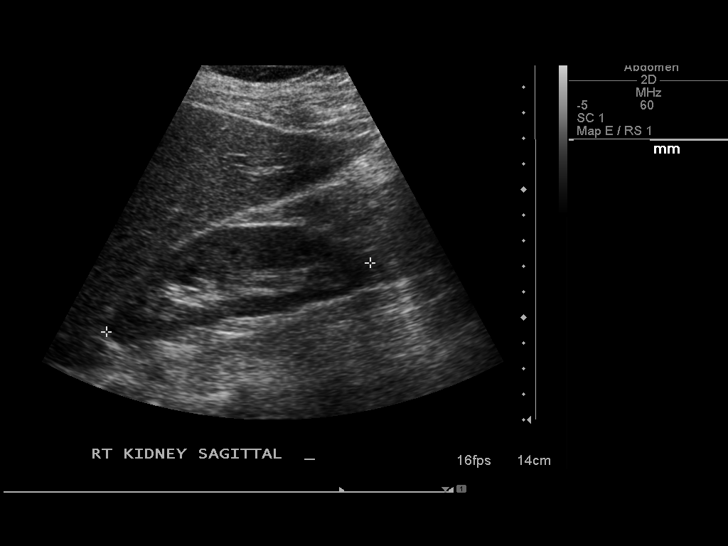
[im 31/74]
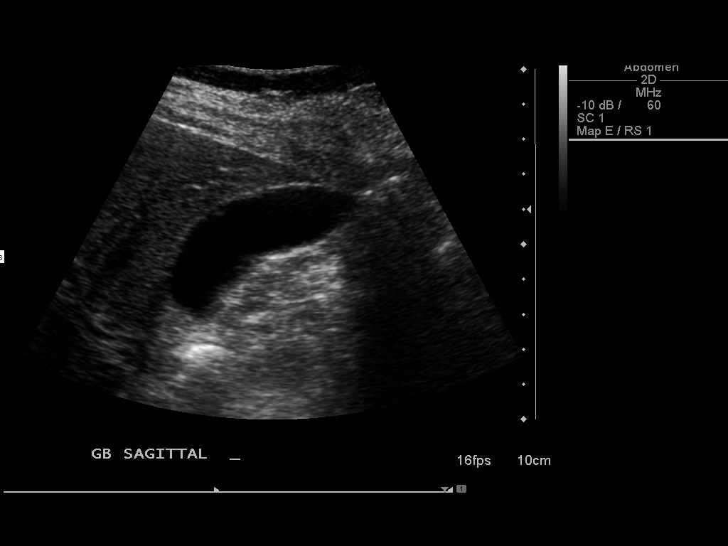
[im 37/74]
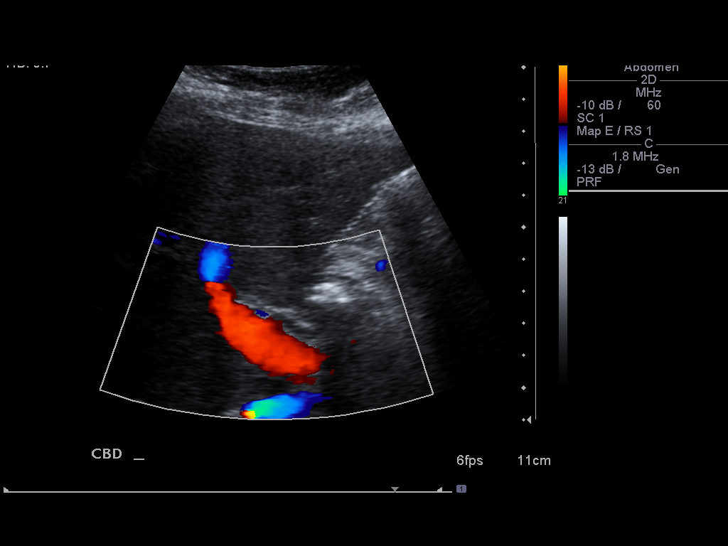
[im 43/74]
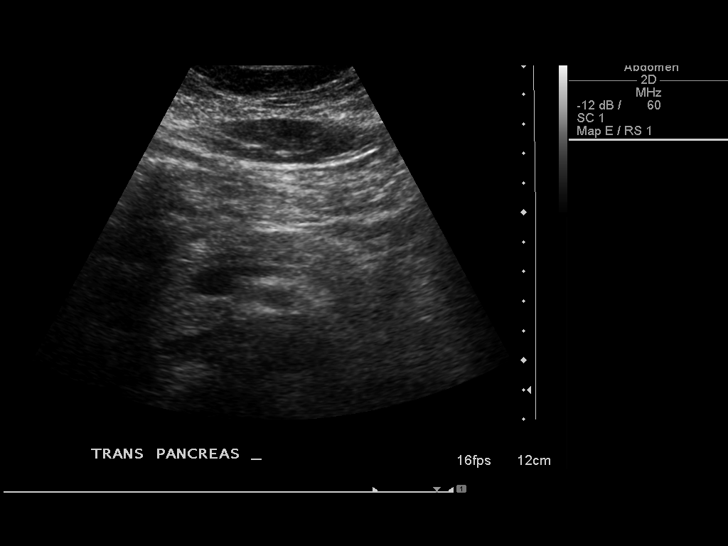
[im 49/74]
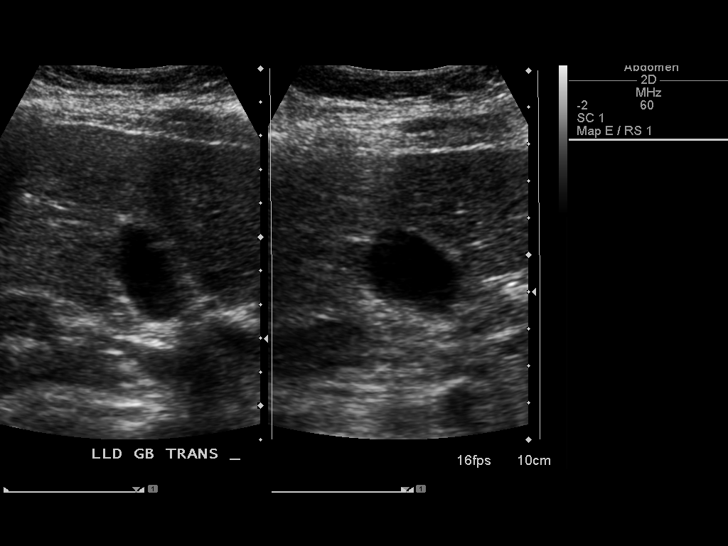
[im 55/74]
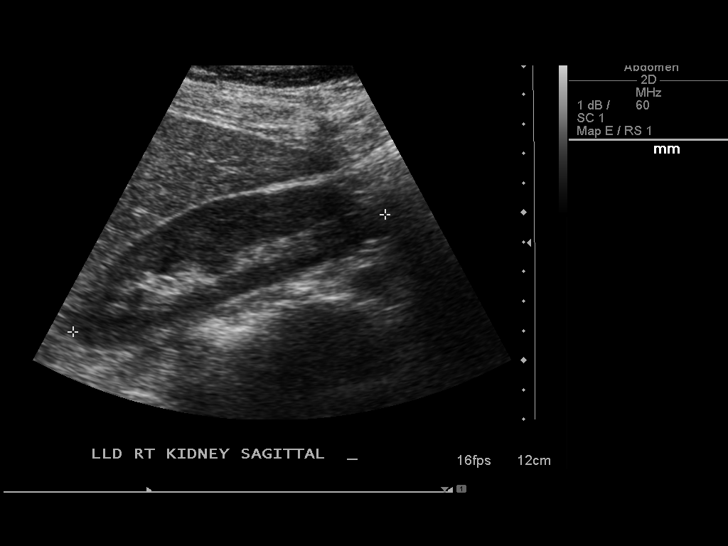
[im 61/74]
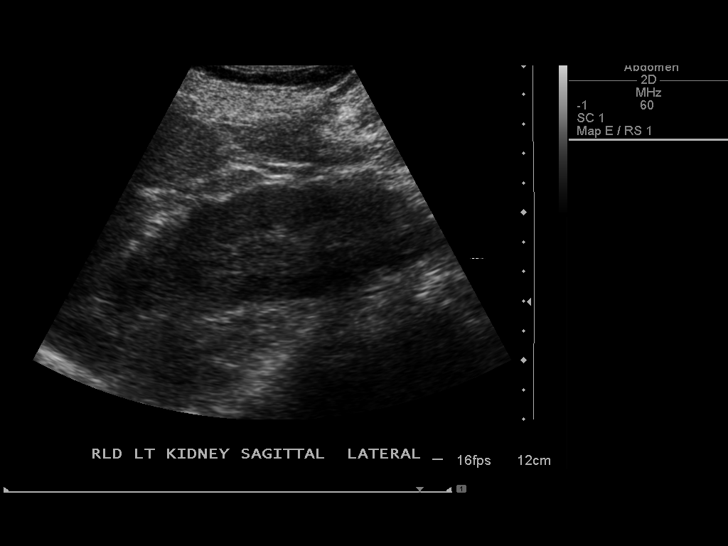
[im 67/74]
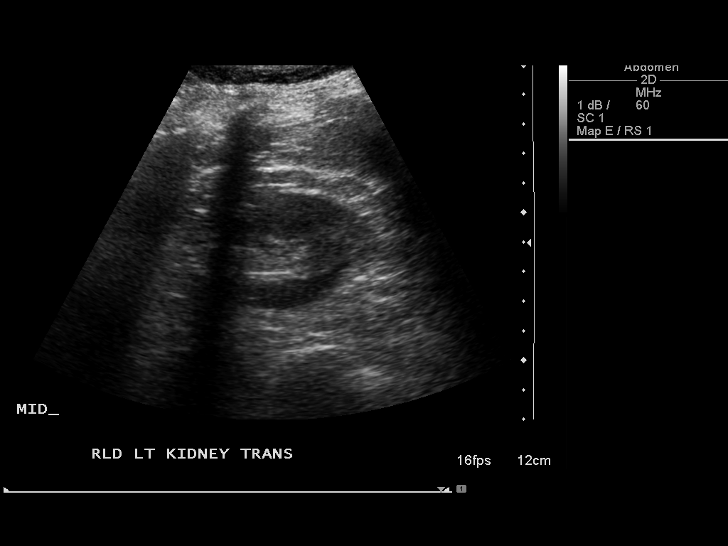
[im 74/74]
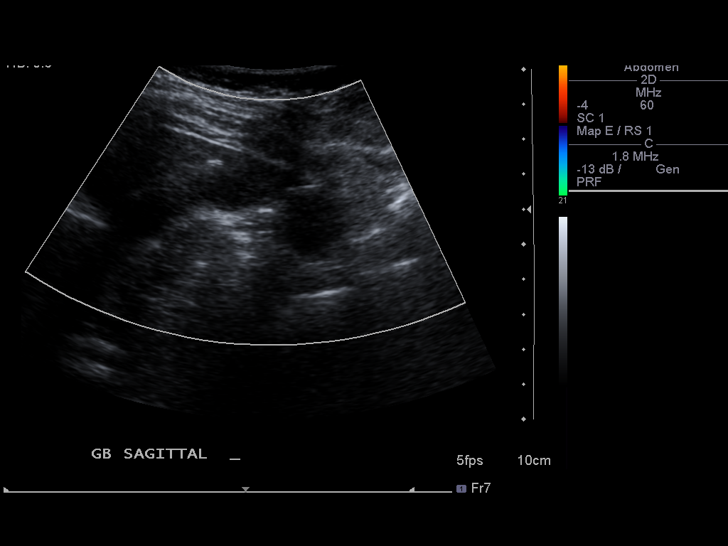

[13 of 25 positions shown; findings below may reference images not displayed]

FINDINGS: Gallbladder:  Focal echogenic structure in the wall of the
gallbladder with ring down artifact consistent with
cholesterolosis.  No evidence of gallbladder wall thickening,
edema, stone, or sludge.

Common bile duct:  Normal caliber with diameter measured at 3 mm.

Liver:  No focal lesion identified.  Within normal limits in
parenchymal echogenicity.

IVC:  Limited visualization due to overlying bowel gas.  Visualized
segments are unremarkable.

Pancreas:  Portions of the head and body of the pancreas are
unremarkable.  Limited visualization of detail due to overlying
bowel gas.

Spleen:  Spleen length measures 4.5 cm.  Normal parenchymal
echotexture.

Right Kidney:  Right kidney measures 11.1 cm length.  No
hydronephrosis.

Left Kidney:  Left kidney measures 12.3 cm length.  No
hydronephrosis.

Abdominal aorta:  Limited segmental visualization due to overlying
bowel gas and visualized segments are not aneurysmal.
IMPRESSION: Changes of cholesterolosis of the gallbladder.  No evidence of
acute cholecystitis.

## 2013-12-10 ENCOUNTER — Ambulatory Visit: Payer: BC Managed Care – PPO | Admitting: Physical Therapy

## 2013-12-10 DIAGNOSIS — IMO0001 Reserved for inherently not codable concepts without codable children: Secondary | ICD-10-CM | POA: Diagnosis not present

## 2013-12-23 IMAGING — NM NM HEPATO W/GB/PHARM/[PERSON_NAME]
3 series · 13 of 13 positions shown · non-contrast
Comparison: CT scan and ultrasound dated 07/20/2012

CLINICAL DATA: Abdominal pain.  Abnormal ultrasound dated
07/20/2012

NUCLEAR MEDICINE HEPATOBILIARY WITH GB, PHARM AND QUAN MEASURE
Radiopharmaceutical:  5.0 mCi technetium Choletec IV
1.35 mcg of CCK infused over 30 minutes IV.  The patient had no
pain during infusion.

[Series 0: hepato · 3.20mm/px · 6 of 30 frames shown (1 of 3)]
[frame 3/30]
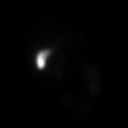
[frame 8/30]
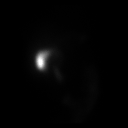
[frame 13/30]
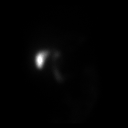
[frame 18/30]
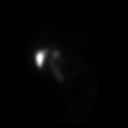
[frame 23/30]
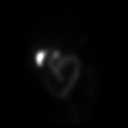
[frame 28/30]
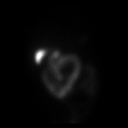

[Series 0: hepato · 3.20mm/px · 6 of 58 frames shown (2 of 3)]
[frame 5/58]
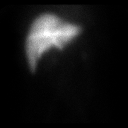
[frame 15/58]
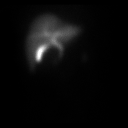
[frame 25/58]
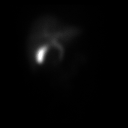
[frame 34/58]
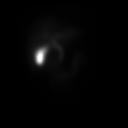
[frame 44/58]
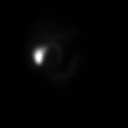
[frame 54/58]
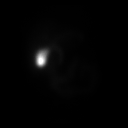

[Series 0: hepato · 1 of 1 slices shown (3 of 3)]
[im 1/1]
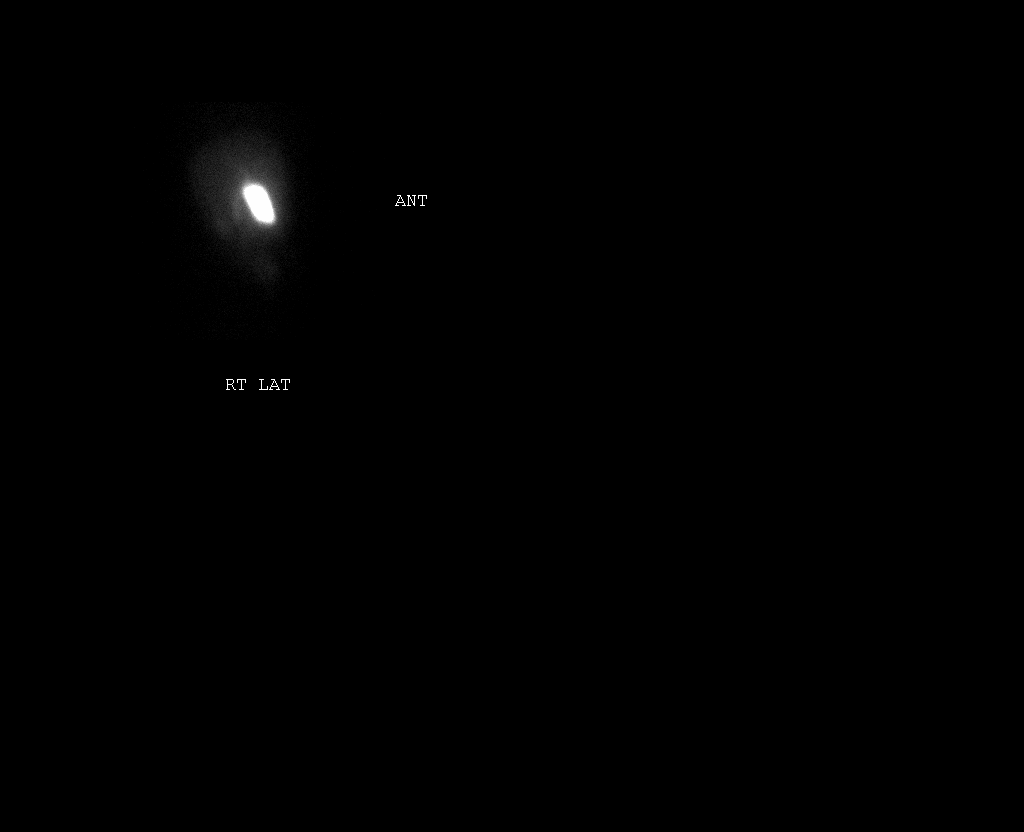

[13 of 13 positions shown; findings below may reference images not displayed]

FINDINGS: The gallbladder is visualized at 5 minutes.  Activity is
seen in the bowel at 20 minutes.

After CCK injection the ejection fraction was 68% at 27 minutes.
IMPRESSION: Normal hepatobiliary scan with normal ejection fraction.

 Review of the prior ultrasound and of the CT scan demonstrates a
tiny calcification adjacent to the gallbladder which I feel
represents a granuloma in the liver rather than cholesterolosis,
which it had simulated on the ultrasound.

## 2014-01-26 ENCOUNTER — Encounter: Payer: Self-pay | Admitting: Nurse Practitioner

## 2014-06-02 ENCOUNTER — Other Ambulatory Visit: Payer: Self-pay | Admitting: Physician Assistant

## 2014-07-21 ENCOUNTER — Other Ambulatory Visit: Payer: Self-pay | Admitting: Physician Assistant

## 2014-07-27 ENCOUNTER — Other Ambulatory Visit: Payer: Self-pay | Admitting: Gastroenterology

## 2014-08-21 ENCOUNTER — Ambulatory Visit (INDEPENDENT_AMBULATORY_CARE_PROVIDER_SITE_OTHER): Payer: BLUE CROSS/BLUE SHIELD | Admitting: Nurse Practitioner

## 2014-08-21 ENCOUNTER — Encounter: Payer: Self-pay | Admitting: Nurse Practitioner

## 2014-08-21 VITALS — BP 126/70 | HR 68 | Ht 64.25 in | Wt 159.0 lb

## 2014-08-21 DIAGNOSIS — Z01419 Encounter for gynecological examination (general) (routine) without abnormal findings: Secondary | ICD-10-CM

## 2014-08-21 NOTE — Patient Instructions (Addendum)

## 2014-08-21 NOTE — Progress Notes (Signed)
Patient ID: Jacqueline Cox, female   DOB: [birth year], 64 y.o.   MRN: [id number] 64 y.o. G2P2000 Married  Caucasian Fe here for annual exam.  Had a bad flare of back pain due to spondylo thesis in August.  Then epidural steroid  In February and had IM steroid for bursitis of left hip.  Last Hgb AIC is 7.6.  She is working of getting BS under control. Vit D was 41.  Patient's last menstrual period was 07/25/1988.          Sexually active: No.  The current method of family planning is abstinence and status post hysterectomy.    Exercising: No.  The patient does not participate in regular exercise at present. Smoker:  no  Health Maintenance: Pap:  05/29/06, normal MMG:  08/25/13, with diagnostic left w/ultrasound; fibroglandular cyst, advised to repeat in 6 months Repeat in 03/10/14, Bi-Rads 1:  Negative, to have bilateral diagnostic 08/2014 Colonoscopy:  06/2014, polyps x 5, repeat in 5 years BMD:   2013, at [facility], normal per patient TDaP:  12/15/10 Labs:  Pt had labs on 08/17/14 at work.  Pt brought copy for office.   reports that she has never smoked. She has never used smokeless tobacco. She reports that she does not drink alcohol or use illicit drugs.  Past Medical History  Diagnosis Date  . Anxiety   . Diabetes mellitus without complication 06/6960    type 2  . Hypertension late 4s  . Psoriasis age 81    hair line, pubic, hands    Past Surgical History  Procedure Laterality Date  . Abdominal hysterectomy  1990    TAH, AUB, ? endometriosis, cervical dysplasia  . Tubal ligation  1986  . Cervical discectomy  1994    C 4-5  . Open repair periarticular fracture / dislocation elbow Right 2000 for 3 surgeries    secondary to a fall  . Incision / debridement bone elbow Right 2001  . Shoulder debridement Left 07/2009    bone spur  . Breast surgery Left 1993 & 1995    benign BX    Current Outpatient Prescriptions  Medication Sig Dispense Refill  . amLODipine (NORVASC) 5 MG  tablet Take 5 mg by mouth daily.    Marland Kitchen aspirin 81 MG tablet Take 81 mg by mouth daily.    Marland Kitchen atorvastatin (LIPITOR) 20 MG tablet Take 20 mg by mouth daily.    . busPIRone (BUSPAR) 7.5 MG tablet Take 7.5 mg by mouth 2 (two) times daily.     . cetirizine (ZYRTEC) 10 MG tablet Take 10 mg by mouth daily.    . cholecalciferol (VITAMIN D) 1000 UNITS tablet Take 2,000 Units by mouth daily.     Marland Kitchen HYDROcodone-acetaminophen (NORCO/VICODIN) 5-325 MG per tablet Take 2 tablets by mouth every 4 (four) hours as needed for pain. 6 tablet 0  . lisinopril-hydrochlorothiazide (PRINZIDE,ZESTORETIC) 20-12.5 MG per tablet Take 1 tablet by mouth daily.    . metFORMIN (GLUCOPHAGE) 500 MG tablet Take 500 mg by mouth 2 (two) times daily with a meal.    . methocarbamol (ROBAXIN) 500 MG tablet Take 1 tablet by mouth as needed.    . Multiple Vitamin (MULTIVITAMIN) capsule Take 1 capsule by mouth daily.    Marland Kitchen omeprazole (PRILOSEC) 20 MG capsule Take 20 mg by mouth daily.    . tacrolimus (PROTOPIC) 0.1 % ointment Apply topically 2 (two) times daily.    . traMADol (ULTRAM) 50 MG tablet Take 1 tablet by  mouth as needed.     No current facility-administered medications for this visit.    Family History  Problem Relation Age of Onset  . Hypertension Mother   . Brain cancer Mother 48  . Hypertension Father   . Diabetes Father   . Hypertension Sister   . Diabetes Sister   . Hypertension Brother   . Lung cancer Brother 43  . Diabetes Brother     ROS:  Pertinent items are noted in HPI.  Otherwise, a comprehensive ROS was negative.  Exam:   BP 126/70 mmHg  Pulse 68  Ht 5' 4.25" (1.632 m)  Wt 159 lb (72.122 kg)  BMI 27.08 kg/m2  LMP 07/25/1988 Height: 5' 4.25" (163.2 cm) Ht Readings from Last 3 Encounters:  08/21/14 5' 4.25" (1.632 m)  08/19/13 5' 4.5" (1.638 m)  07/18/12 5\' 5"  (1.651 m)    General appearance: alert, cooperative and appears stated age Head: Normocephalic, without obvious abnormality,  atraumatic Neck: no adenopathy, supple, symmetrical, trachea midline and thyroid normal to inspection and palpation Lungs: clear to auscultation bilaterally Breasts: normal appearance, no masses or tenderness Heart: regular rate and rhythm Abdomen: soft, non-tender; no masses,  no organomegaly Extremities: extremities normal, atraumatic, no cyanosis or edema Skin: Skin color, texture, turgor normal. No rashes or lesions Lymph nodes: Cervical, supraclavicular, and axillary nodes normal. No abnormal inguinal nodes palpated Neurologic: Grossly normal   Pelvic: External genitalia:  no lesions              Urethra:  normal appearing urethra with no masses, tenderness or lesions              Bartholin's and Skene's: normal                 Vagina: normal appearing vagina with normal color and discharge, no lesions              Cervix: absent              Pap taken: No. Bimanual Exam:  Uterus:  uterus absent              Adnexa: no mass, fullness, tenderness               Rectovaginal: Confirms               Anus:  normal sphincter tone, no lesions  Chaperone present:  no  A:  Well Woman with normal exam  S/P TAH secondary to AUB and abnormal pap 1990 (over 20 yrs) took ERT for 5 years  Remote history of condyloma 1990  History of left breast cyst and to have diagnostic Mammo in June   P:   Reviewed health and wellness pertinent to exam  Pap smear as above  Mammogram is due 08/2014  May take Slow Fe daily for anemia labs at PCP HGB 11.3  Counseled on breast self exam, mammography screening, adequate intake of calcium and vitamin D, diet and exercise return annually or prn  An After Visit Summary was printed and given to the patient.  Labs are sent to be scanned.

## 2014-08-23 NOTE — Progress Notes (Signed)
Encounter reviewed by Dr. Teffany Blaszczyk Silva.  

## 2015-08-25 ENCOUNTER — Ambulatory Visit: Payer: BLUE CROSS/BLUE SHIELD | Admitting: Nurse Practitioner

## 2015-09-07 ENCOUNTER — Encounter: Payer: Self-pay | Admitting: Nurse Practitioner

## 2015-09-07 ENCOUNTER — Ambulatory Visit (INDEPENDENT_AMBULATORY_CARE_PROVIDER_SITE_OTHER): Payer: BLUE CROSS/BLUE SHIELD | Admitting: Nurse Practitioner

## 2015-09-07 VITALS — BP 110/68 | HR 82 | Resp 18 | Ht 64.5 in | Wt 151.0 lb

## 2015-09-07 DIAGNOSIS — M545 Low back pain, unspecified: Secondary | ICD-10-CM

## 2015-09-07 DIAGNOSIS — G8929 Other chronic pain: Secondary | ICD-10-CM | POA: Diagnosis not present

## 2015-09-07 DIAGNOSIS — Z01419 Encounter for gynecological examination (general) (routine) without abnormal findings: Secondary | ICD-10-CM | POA: Diagnosis not present

## 2015-09-07 NOTE — Progress Notes (Signed)
Encounter reviewed Nikea Settle, MD   

## 2015-09-07 NOTE — Patient Instructions (Addendum)

## 2015-09-07 NOTE — Progress Notes (Signed)
65 y.o. G74P2000 Married  Caucasian Fe here for annual exam.  Multiple health problems with Bursitis in left hip with cortisone injections.  Then had 1 epidural and then nerve block to help with low back pain.  Last HGB AIC was 7.2 secondary to cortisone injections.  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    Husband is in poor health. He has multiple bone abnormalities and had back surgery, C diff and irregular heart issues.    Patient's last menstrual period was 07/25/1988.          Sexually active: No.  The current method of family planning is post menopausal, S/P TAH Exercising: No.  The patient does not participate in regular exercise at present. Smoker:  no  Health Maintenance: Pap:  05/29/06 Normal  MMG:  09/11/14 [place] Left BIRADS2:Neg. Has apt tomorrow.  Colonoscopy:  12/2014 f/u 5 years  BMD:   04/26/10  TDaP:  11/2010  Shingles: 2016 Pneumonia: 2016 Hep C ~06/2015 Neg Labs: PCP   reports that she has never smoked. She has never used smokeless tobacco. She reports that she does not drink alcohol or use illicit drugs.  Past Medical History  Diagnosis Date  . Anxiety   . Diabetes mellitus without complication ([place]) [id number]    type 2  . Hypertension late 59s  . Psoriasis age 47    hair line, pubic, hands    Past Surgical History  Procedure Laterality Date  . Abdominal hysterectomy  1990    TAH, AUB, ? endometriosis, cervical dysplasia  . Tubal ligation  1986  . Cervical discectomy  1994    C 4-5  . Open repair periarticular fracture / dislocation elbow Right 2000 for 3 surgeries    secondary to a fall  . Incision / debridement bone elbow Right 2001  . Shoulder debridement Left 07/2009    bone spur  . Breast surgery Left 1993 & 1995    benign BX    Current Outpatient Prescriptions  Medication Sig Dispense Refill  . amLODipine (NORVASC) 5 MG tablet Take 5 mg by mouth daily.    Marland Kitchen atorvastatin (LIPITOR) 20 MG tablet Take 20 mg by mouth daily.    .  busPIRone (BUSPAR) 7.5 MG tablet Take 7.5 mg by mouth 2 (two) times daily.     . cetirizine (ZYRTEC) 10 MG tablet Take 10 mg by mouth daily.    . cholecalciferol (VITAMIN D) 1000 UNITS tablet Take 2,000 Units by mouth daily.     Marland Kitchen lisinopril-hydrochlorothiazide (PRINZIDE,ZESTORETIC) 20-12.5 MG per tablet Take 1 tablet by mouth daily.    . metFORMIN (GLUCOPHAGE) 1000 MG tablet Take 1 tablet by mouth daily.    . methocarbamol (ROBAXIN) 500 MG tablet Take 1 tablet by mouth as needed.    . Multiple Vitamin (MULTIVITAMIN) capsule Take 1 capsule by mouth daily.    Marland Kitchen omeprazole (PRILOSEC) 20 MG capsule Take 20 mg by mouth daily.    . tacrolimus (PROTOPIC) 0.1 % ointment Apply topically 2 (two) times daily.    . traMADol (ULTRAM) 50 MG tablet Take 1 tablet by mouth as needed.    . SitaGLIPtin-MetFORMIN HCl (JANUMET XR) [phone] MG TB24 Take 1 tablet by mouth daily. Reported on 09/07/2015     No current facility-administered medications for this visit.    Family History  Problem Relation Age of Onset  . Hypertension Mother   . Brain cancer Mother 30  . Hypertension Father   . Diabetes Father   .  Hypertension Sister   . Diabetes Sister   . Hypertension Brother   . Lung cancer Brother 38  . Diabetes Brother     ROS:  Pertinent items are noted in HPI.  Otherwise, a comprehensive ROS was negative.  Exam:   BP 110/68 mmHg  Pulse 82  Resp 18  Ht 5' 4.5" (1.638 m)  Wt 151 lb (68.493 kg)  BMI 25.53 kg/m2  LMP 07/25/1988 Height: 5' 4.5" (163.8 cm) Ht Readings from Last 3 Encounters:  09/07/15 5' 4.5" (1.638 m)  08/21/14 5' 4.25" (1.632 m)  08/19/13 5' 4.5" (1.638 m)    General appearance: alert, cooperative and appears stated age Head: Normocephalic, without obvious abnormality, atraumatic Neck: no adenopathy, supple, symmetrical, trachea midline and thyroid normal to inspection and palpation Lungs: clear to auscultation bilaterally Breasts: normal appearance, no masses or  tenderness Heart: regular rate and rhythm Abdomen: soft, non-tender; no masses,  no organomegaly Extremities: extremities normal, atraumatic, no cyanosis or edema Skin: Skin color, texture, turgor normal. No rashes or lesions Lymph nodes: Cervical, supraclavicular, and axillary nodes normal. No abnormal inguinal nodes palpated Neurologic: Grossly normal   Pelvic: External genitalia:  no lesions              Urethra:  normal appearing urethra with no masses, tenderness or lesions              Bartholin's and Skene's: normal                 Vagina: normal appearing vagina with normal color and discharge, no lesions              Cervix: absent              Pap taken: No. Bimanual Exam:  Uterus:  uterus absent              Adnexa: no mass, fullness, tenderness               Rectovaginal: Confirms               Anus:  normal sphincter tone, no lesions  Chaperone present: no  A:  Well Woman with normal exam  S/P TAH secondary to AUB and abnormal pap 1990 (over 20 yrs) took ERT for 5 years Remote history of condyloma 1990 chronic back pain, DM, HTN  P:   Reviewed health and wellness pertinent to exam  Pap smear as above  Mammogram is tomorrow  Will get BMD with PCP  Counseled on breast self exam, mammography screening, adequate intake of calcium and vitamin D, diet and exercise return annually or prn  An After Visit Summary was printed and given to the patient.

## 2015-09-09 ENCOUNTER — Telehealth: Payer: Self-pay | Admitting: *Deleted

## 2015-09-09 ENCOUNTER — Encounter: Payer: Self-pay | Admitting: Nurse Practitioner

## 2015-09-09 NOTE — Telephone Encounter (Signed)
When I was in the office on Tuesday, 6/13 Patty made several notes as "homework" in my file that I was to follow up on. When I checked out these were not included in the notes I was given. Are these available through MyChart?     Above My Chart message received from patient. Please advise on what instructions you have for patient.

## 2015-09-10 NOTE — Telephone Encounter (Signed)
My Chart message sent back to patient. AVS printed and mailed to patient.   Encounter closed.

## 2015-09-10 NOTE — Telephone Encounter (Signed)
Telephone note created for this encounter to review with Patty.

## 2015-09-10 NOTE — Telephone Encounter (Signed)
Notes were on the bottom of AVS.

## 2016-09-08 ENCOUNTER — Ambulatory Visit (INDEPENDENT_AMBULATORY_CARE_PROVIDER_SITE_OTHER): Payer: BLUE CROSS/BLUE SHIELD | Admitting: Nurse Practitioner

## 2016-09-08 ENCOUNTER — Encounter: Payer: Self-pay | Admitting: Nurse Practitioner

## 2016-09-08 VITALS — BP 110/76 | HR 72 | Resp 16 | Ht 64.25 in | Wt 151.8 lb

## 2016-09-08 DIAGNOSIS — Z01419 Encounter for gynecological examination (general) (routine) without abnormal findings: Secondary | ICD-10-CM

## 2016-09-08 NOTE — Progress Notes (Signed)
Patient ID: Jacqueline Cox, female   DOB: [birth year], 66 y.o.   MRN: [id number]  66 y.o. G2P2002 Married  Caucasian Fe here for annual exam.  Bilateral cataract removal  2/18 & 4/18.   Doing well.  The hip and back pain is ongoing and occasionally will get cortisone treatments.  Patient's last menstrual period was 07/25/1988.          Sexually active: No.  The current method of family planning is status post hysterectomy and post menopausal status.    Exercising: No.  The patient does not participate in regular exercise at present. Smoker:  no  Health Maintenance: Pap: 05/29/06, Negative (hysterectomy) History of Abnormal Pap: no MMG: 09/05/16, 3D-yes, Density Category C, Bi-Rads 2:  Benign Self Breast exams: yes Colonoscopy: 08/03/14, polyp in the past, repeat in 5 years BMD: 04/26/10 "normal"   (scheduled for July 2018 by PCP) TDaP: 11/28/10 Shingles: 07/16/12 Pneumonia: 06/11/12 Hep C and HIV: ~06/2015, Negative Labs: PCP takes care of all labs   reports that she has never smoked. She has never used smokeless tobacco. She reports that she does not drink alcohol or use drugs.  Past Medical History:  Diagnosis Date  . Anxiety   . Diabetes mellitus without complication ([place]) 09/5100   type 2  . Hypertension late 22s  . Psoriasis age 58   hair line, pubic, hands    Past Surgical History:  Procedure Laterality Date  . ABDOMINAL HYSTERECTOMY  1990   TAH, AUB, ? endometriosis, cervical dysplasia  . BREAST SURGERY Left 1993 & 1995   benign BX  . CERVICAL DISCECTOMY  1994   C 4-5  . INCISION / [place] BONE ELBOW Right 2001  . OPEN REPAIR PERIARTICULAR FRACTURE / DISLOCATION ELBOW Right 2000 for 3 surgeries   secondary to a fall  . SHOULDER DEBRIDEMENT Left 07/2009   bone spur  . TUBAL LIGATION  1986    Current Outpatient Prescriptions  Medication Sig Dispense Refill  . amLODipine (NORVASC) 5 MG tablet Take 5 mg by mouth daily.    Marland Kitchen atorvastatin (LIPITOR) 20 MG tablet Take 20 mg  by mouth daily.    . busPIRone (BUSPAR) 7.5 MG tablet Take 7.5 mg by mouth 2 (two) times daily.     . cetirizine (ZYRTEC) 10 MG tablet Take 10 mg by mouth daily.    . cholecalciferol (VITAMIN D) 1000 UNITS tablet Take 2,000 Units by mouth daily.     Marland Kitchen lisinopril-hydrochlorothiazide (PRINZIDE,ZESTORETIC) 20-12.5 MG per tablet Take 1 tablet by mouth daily.    . methocarbamol (ROBAXIN) 500 MG tablet Take 1 tablet by mouth as needed.    . Multiple Vitamin (MULTIVITAMIN) capsule Take 1 capsule by mouth daily.    Marland Kitchen omeprazole (PRILOSEC) 20 MG capsule Take 20 mg by mouth daily.    . SitaGLIPtin-MetFORMIN HCl (JANUMET XR) [phone] MG TB24 Take 1 tablet by mouth daily. Reported on 09/07/2015    . traMADol (ULTRAM) 50 MG tablet Take 1 tablet by mouth as needed.     No current facility-administered medications for this visit.     Family History  Problem Relation Age of Onset  . Hypertension Mother   . Brain cancer Mother 73  . Hypertension Father   . Diabetes Father   . Diabetes Brother   . Hypertension Brother   . Lung cancer Brother 25       Lung cancer  . Hypertension Sister   . Hyperlipidemia Sister   . Diabetes Sister   .  Hypertension Sister   . Parkinson's disease Sister     ROS:  Pertinent items are noted in HPI.  Otherwise, a comprehensive ROS was negative.  Exam:   BP 110/76 (BP Location: Right Arm, Patient Position: Sitting, Cuff Size: Normal)   Pulse 72   Resp 16   Ht 5' 4.25" (1.632 m)   Wt 151 lb 12.8 oz (68.9 kg)   LMP 07/25/1988   BMI 25.85 kg/m  Height: 5' 4.25" (163.2 cm) Ht Readings from Last 3 Encounters:  09/08/16 5' 4.25" (1.632 m)  09/07/15 5' 4.5" (1.638 m)  08/21/14 5' 4.25" (1.632 m)    General appearance: alert, cooperative and appears stated age Head: Normocephalic, without obvious abnormality, atraumatic Neck: no adenopathy, supple, symmetrical, trachea midline and thyroid normal to inspection and palpation Lungs: clear to auscultation  bilaterally Breasts: normal appearance, no masses or tenderness Heart: regular rate and rhythm Abdomen: soft, non-tender; no masses,  no organomegaly Extremities: extremities normal, atraumatic, no cyanosis or edema Skin: Skin color, texture, turgor normal. No rashes or lesions Lymph nodes: Cervical, supraclavicular, and axillary nodes normal. No abnormal inguinal nodes palpated Neurologic: Grossly normal   Pelvic: External genitalia:  no lesions              Urethra:  normal appearing urethra with no masses, tenderness or lesions              Bartholin's and Skene's: normal                 Vagina: normal appearing vagina with normal color and discharge, no lesions              Cervix: absent              Pap taken: No. Bimanual Exam:  Uterus:  uterus absent              Adnexa: no mass, fullness, tenderness               Rectovaginal: Confirms               Anus:  normal sphincter tone, no lesions  Chaperone present: no  A:  Well Woman with normal exam  S/P TAH secondary to AUB and abnormal pap 1990 (over 20 yrs)  Postmenopausal and took ERT for 5 yrs early 19's  Remote condyloma 1990  History of DM, HTN, back and hip pain    P:   Reviewed health and wellness pertinent to exam  Pap smear: no  Mammogram is due 6/19   counseled on breast self exam, mammography screening, adequate intake of calcium and vitamin D, diet and exercise return annually or prn  An After Visit Summary was printed and given to the patient.

## 2016-09-08 NOTE — Patient Instructions (Addendum)

## 2016-09-10 NOTE — Progress Notes (Signed)
Encounter reviewed by Dr. Nobuko Gsell Amundson C. Silva.  

## 2016-10-20 ENCOUNTER — Telehealth: Payer: Self-pay | Admitting: Obstetrics and Gynecology

## 2016-10-20 ENCOUNTER — Ambulatory Visit (INDEPENDENT_AMBULATORY_CARE_PROVIDER_SITE_OTHER): Payer: BLUE CROSS/BLUE SHIELD | Admitting: Obstetrics and Gynecology

## 2016-10-20 ENCOUNTER — Encounter: Payer: Self-pay | Admitting: Obstetrics and Gynecology

## 2016-10-20 VITALS — BP 116/60 | HR 88 | Resp 16 | Wt 155.0 lb

## 2016-10-20 DIAGNOSIS — L723 Sebaceous cyst: Secondary | ICD-10-CM | POA: Diagnosis not present

## 2016-10-20 NOTE — Telephone Encounter (Signed)
Patient has a nodule in her groin area and wants to come in for an appointment.

## 2016-10-20 NOTE — Telephone Encounter (Signed)
Spoke with patient. Patient states that she has a nodule on her groin that has been present for 2 weeks. Has been monitoring it. Has not changed in size. Reports a feeling of pressure in the area. Nodule is pink in color. Denies and fluid in nodule. Denies fever, chills, or swelling to the area. Advised she will need to be seen for further evaluation. Patient is agreeable. Appointment scheduled for today 10/20/2016 at 3:30 pm with Dr.Silva.  Routing to provider for final review. Patient agreeable to disposition. Will close encounter.

## 2016-10-20 NOTE — Progress Notes (Signed)
GYNECOLOGY  VISIT   HPI: 66 y.o.   Married  Caucasian  female   G2P2002 with Patient's last menstrual period was 07/25/1988.   here for nodule in left side of groin    Two week ago developed a small mass in her left groin.  Hx sebaceous cysts. Not draining. No fevers, shakes or chills.   States she is not feeling great this issue due to her back pain and not sleeping well.  Seen at her onsite clinic at work and told to have a visit.  Having more back pain.  Has an appt with Dr. Tonita Cong next week.   GYNECOLOGIC HISTORY: Patient's last menstrual period was 07/25/1988. Contraception:  Postmenopausal/ hysterectomy Menopausal hormone therapy:  none Last mammogram:  09/07/16 BIRADS 2 benign Last pap smear:   05/29/06, Negative (hysterectomy)        OB History    Gravida Para Term Preterm AB Living   2 2 2  0 0 2   SAB TAB Ectopic Multiple Live Births   0 0 0 0 2         Patient Active Problem List   Diagnosis Date Noted  . Diabetes mellitus, type 2 ([place]) 06/11/2012  . HLD (hyperlipidemia) 06/11/2012  . BP (high blood pressure) 06/11/2012    Past Medical History:  Diagnosis Date  . Anxiety   . Diabetes mellitus without complication ([place]) 11/2424   type 2  . Hypertension late 77s  . Psoriasis age 31   hair line, pubic, hands    Past Surgical History:  Procedure Laterality Date  . ABDOMINAL HYSTERECTOMY  1990   TAH, AUB, ? endometriosis, cervical dysplasia  . BREAST SURGERY Left 1993 & 1995   benign BX  . CERVICAL DISCECTOMY  1994   C 4-5  . INCISION / [place] BONE ELBOW Right 2001  . OPEN REPAIR PERIARTICULAR FRACTURE / DISLOCATION ELBOW Right 2000 for 3 surgeries   secondary to a fall  . SHOULDER DEBRIDEMENT Left 07/2009   bone spur  . TUBAL LIGATION  1986    Current Outpatient Prescriptions  Medication Sig Dispense Refill  . amLODipine (NORVASC) 5 MG tablet Take 5 mg by mouth daily.    Marland Kitchen atorvastatin (LIPITOR) 20 MG tablet Take 20 mg by mouth daily.    .  busPIRone (BUSPAR) 7.5 MG tablet Take 7.5 mg by mouth 2 (two) times daily.     . cetirizine (ZYRTEC) 10 MG tablet Take 10 mg by mouth daily.    . cholecalciferol (VITAMIN D) 1000 UNITS tablet Take 2,000 Units by mouth daily.     Marland Kitchen lisinopril-hydrochlorothiazide (PRINZIDE,ZESTORETIC) 20-12.5 MG per tablet Take 1 tablet by mouth daily.    . methocarbamol (ROBAXIN) 500 MG tablet Take 1 tablet by mouth as needed.    . Multiple Vitamin (MULTIVITAMIN) capsule Take 1 capsule by mouth daily.    Marland Kitchen omeprazole (PRILOSEC) 20 MG capsule Take 20 mg by mouth daily.    . SitaGLIPtin-MetFORMIN HCl (JANUMET XR) [phone] MG TB24 Take 1 tablet by mouth daily. Reported on 09/07/2015    . traMADol (ULTRAM) 50 MG tablet Take 1 tablet by mouth as needed.     No current facility-administered medications for this visit.      ALLERGIES: Codeine; Paxil [paroxetine hcl]; and Sulfamethoxazole-trimethoprim  Family History  Problem Relation Age of Onset  . Hypertension Mother   . Brain cancer Mother 40  . Hypertension Father   . Diabetes Father   . Diabetes Brother   . Hypertension  Brother   . Lung cancer Brother 24       Lung cancer  . Hypertension Sister   . Hyperlipidemia Sister   . Diabetes Sister   . Hypertension Sister   . Parkinson's disease Sister     Social History   Social History  . Marital status: Married    Spouse name: N/A  . Number of children: N/A  . Years of education: N/A   Occupational History  . Not on file.   Social History Main Topics  . Smoking status: Never Smoker  . Smokeless tobacco: Never Used  . Alcohol use No  . Drug use: No  . Sexual activity: No   Other Topics Concern  . Not on file   Social History Narrative  . No narrative on file    ROS:  Pertinent items are noted in HPI.  PHYSICAL EXAMINATION:    BP 116/60 (BP Location: Right Arm, Patient Position: Sitting, Cuff Size: Normal)   Pulse 88   Resp 16   Wt 155 lb (70.3 kg)   LMP 07/25/1988   BMI 26.40  kg/m     General appearance: alert, cooperative and appears stated age    Pelvic: External genitalia:   Left vulvar sebaceous cyst 6 mm, small head on it.               Urethra:  normal appearing urethra with no masses, tenderness or lesions              Bartholins and Skenes: normal                  Chaperone was present for exam.  ASSESSMENT  Sebaceous cyst.   PLAN  No abx or drainage needed.  Warm compresses.  Call if enlarges or becomes painful. Use loose clothing.  Follow up for annual exam and prn.   An After Visit Summary was printed and given to the patient.  _15_____ minutes face to face time of which over 50% was spent in counseling.

## 2016-10-20 NOTE — Patient Instructions (Signed)
Epidermal Cyst An epidermal cyst is sometimes called an epidermal inclusion cyst or an infundibular cyst. It is a sac made of skin tissue. The sac contains a substance called keratin. Keratin is a protein that is normally secreted through the hair follicles. When keratin becomes trapped in the top layer of skin (epidermis), it can form an epidermal cyst. Epidermal cysts are usually found on the face, neck, trunk, and genitals. These cysts are usually harmless (benign), and they may not cause symptoms unless they become infected. It is important not to pop epidermal cysts yourself. What are the causes? This condition may be caused by:  A blocked hair follicle.  A hair that curls and re-enters the skin instead of growing straight out of the skin (ingrown hair).  A blocked pore.  Irritated skin.  An injury to the skin.  Certain conditions that are passed along from parent to child (inherited).  Human papillomavirus (HPV).  What increases the risk? The following factors may make you more likely to develop an epidermal cyst:  Having acne.  Being overweight.  Wearing tight clothing.  What are the signs or symptoms? The only symptom of this condition may be a small, painless lump underneath the skin. When an epidermal cyst becomes infected, symptoms may include:  Redness.  Inflammation.  Tenderness.  Warmth.  Fever.  Keratin draining from the cyst. Keratin may look like a grayish-white, bad-smelling substance.  Pus draining from the cyst.  How is this diagnosed? This condition is diagnosed with a physical exam. In some cases, you may have a sample of tissue (biopsy) taken from your cyst to be examined under a microscope or tested for bacteria. You may be referred to a health care provider who specializes in skin care (dermatologist). How is this treated? In many cases, epidermal cysts go away on their own without treatment. If a cyst becomes infected, treatment may  include:  Opening and draining the cyst. After draining, minor surgery to remove the rest of the cyst may be done.  Antibiotic medicine to help prevent infection.  Injections of medicines (steroids) that help to reduce inflammation.  Surgery to remove the cyst. Surgery may be done if: ? The cyst becomes large. ? The cyst bothers you. ? There is a chance that the cyst could turn into cancer.  Follow these instructions at home:  Take over-the-counter and prescription medicines only as told by your health care provider.  If you were prescribed an antibiotic, use it as told by your health care provider. Do not stop using the antibiotic even if you start to feel better.  Keep the area around your cyst clean and dry.  Wear loose, dry clothing.  Do not try to pop your cyst.  Avoid touching your cyst.  Check your cyst every day for signs of infection.  Keep all follow-up visits as told by your health care provider. This is important. How is this prevented?  Wear clean, dry, clothing.  Avoid wearing tight clothing.  Keep your skin clean and dry. Shower or take baths every day.  Wash your body with a benzoyl peroxide wash when you shower or bathe. Contact a health care provider if:  Your cyst develops symptoms of infection.  Your condition is not improving or is getting worse.  You develop a cyst that looks different from other cysts you have had.  You have a fever. Get help right away if:  Redness spreads from the cyst into the surrounding area. This information is   not intended to replace advice given to you by your health care provider. Make sure you discuss any questions you have with your health care provider. Document Released: 02/12/2004 Document Revised: 11/10/2015 Document Reviewed: 01/13/2015 Elsevier Interactive Patient Education  2018 [employer] Inc[employer]  

## 2016-11-28 ENCOUNTER — Encounter: Payer: Self-pay | Admitting: Obstetrics and Gynecology

## 2016-11-28 ENCOUNTER — Telehealth: Payer: Self-pay | Admitting: Obstetrics and Gynecology

## 2016-11-28 ENCOUNTER — Ambulatory Visit (INDEPENDENT_AMBULATORY_CARE_PROVIDER_SITE_OTHER): Payer: BLUE CROSS/BLUE SHIELD | Admitting: Obstetrics and Gynecology

## 2016-11-28 VITALS — BP 122/60 | HR 84 | Resp 16 | Wt 156.0 lb

## 2016-11-28 DIAGNOSIS — R350 Frequency of micturition: Secondary | ICD-10-CM

## 2016-11-28 DIAGNOSIS — L0292 Furuncle, unspecified: Secondary | ICD-10-CM | POA: Diagnosis not present

## 2016-11-28 DIAGNOSIS — R300 Dysuria: Secondary | ICD-10-CM

## 2016-11-28 LAB — POCT URINALYSIS DIPSTICK
Bilirubin, UA: NEGATIVE
GLUCOSE UA: NEGATIVE
KETONES UA: NEGATIVE
Leukocytes, UA: NEGATIVE
Nitrite, UA: NEGATIVE
Protein, UA: NEGATIVE
RBC UA: NEGATIVE
UROBILINOGEN UA: NEGATIVE U/dL — AB
pH, UA: 6.5 (ref 5.0–8.0)

## 2016-11-28 MED ORDER — AMOXICILLIN-POT CLAVULANATE 500-125 MG PO TABS: ORAL_TABLET | ORAL | 0 refills | Status: DC

## 2016-11-28 NOTE — Telephone Encounter (Signed)
Patient said "I was seen recently for a cyst and it has grown". Patient said "it did drain a little over the weekend". Patient is asking what does she need to do?

## 2016-11-28 NOTE — Telephone Encounter (Signed)
Spoke with patient. Reports left groin sebaceous cyst that has gotten larger and tender since last OV on 10/20/16 with Dr. Quincy Simmonds. Patient states she was to watch area and apply heat, call with changes/concerns.  Area got bigger towards the end of last [place] while [place] the beach, developed a head and drained very little on Saturday night. Feels like size of large grape under the skin. Patient states area is tender and is aware it is there. Denies fever/chills.   Reports recently treated for UTI by PCP, completed Macrobid x7 days on 11/27/16.   Recommended OV for further evaluation. Advised patient Dr. Quincy Simmonds is out of the office, can schedule with covering provider. Patient scheduled for today at 11:30am with Dr. Talbert Nan. Patient verbalizes understanding and is agreeable.  Routing to provider for final review. Patient is agreeable to disposition. Will close encounter.  Cc: Dr. Quincy Simmonds

## 2016-11-28 NOTE — Progress Notes (Signed)
GYNECOLOGY  VISIT   HPI: 66 y.o.   Married  Caucasian  female   G2P2002 with Patient's last menstrual period was 07/25/1988.   here c/o vaginal cyst. She is also c/o dysuria. She was treated last week for a UTI but feels that her symptoms are not much better     The patient noticed a small bump on her left vulva about a month ago. Initially not tender. Saw Dr Quincy Simmonds, was told to use warm compresses on it. She has been doing that, it never went away. On Friday it started feeling tender, on Saturday it was coming to a head. During the night it drained some. She used warm compresses this weekend. It isn't throbbing, but it's tender to touch or to sit on it. She feels it is swollen under neath the lump. She has diabetes, well controlled. She was treated by primary last week for a UTI. She was on macrobid for 1 week, finished it last night. She feels better, but not normal. She is still having mild dysuria, slight urge incontinence, some frequency. Urine culture results reviewed, + for e coli, sensitive to Macrobid and Augmentin (as well as other antibiotics).    GYNECOLOGIC HISTORY: Patient's last menstrual period was 07/25/1988. Contraception:postmenopause  Menopausal hormone therapy: none         OB History    Gravida Para Term Preterm AB Living   2 2 2  0 0 2   SAB TAB Ectopic Multiple Live Births   0 0 0 0 2         Patient Active Problem List   Diagnosis Date Noted  . Diabetes mellitus, type 2 ([place]) 06/11/2012  . HLD (hyperlipidemia) 06/11/2012  . BP (high blood pressure) 06/11/2012    Past Medical History:  Diagnosis Date  . Anxiety   . Diabetes mellitus without complication ([place]) 04/8411   type 2  . Hypertension late 79s  . Psoriasis age 59   hair line, pubic, hands    Past Surgical History:  Procedure Laterality Date  . ABDOMINAL HYSTERECTOMY  1990   TAH, AUB, ? endometriosis, cervical dysplasia  . BREAST SURGERY Left 1993 & 1995   benign BX  . CERVICAL DISCECTOMY  1994    C 4-5  . INCISION / [place] BONE ELBOW Right 2001  . OPEN REPAIR PERIARTICULAR FRACTURE / DISLOCATION ELBOW Right 2000 for 3 surgeries   secondary to a fall  . SHOULDER DEBRIDEMENT Left 07/2009   bone spur  . TUBAL LIGATION  1986    Current Outpatient Prescriptions  Medication Sig Dispense Refill  . amLODipine (NORVASC) 5 MG tablet Take 5 mg by mouth daily.    Marland Kitchen atorvastatin (LIPITOR) 20 MG tablet Take 20 mg by mouth daily.    . busPIRone (BUSPAR) 7.5 MG tablet Take 7.5 mg by mouth 2 (two) times daily.     . cetirizine (ZYRTEC) 10 MG tablet Take 10 mg by mouth daily.    . cholecalciferol (VITAMIN D) 1000 UNITS tablet Take 2,000 Units by mouth daily.     Marland Kitchen gabapentin (NEURONTIN) 100 MG capsule Take 100 mg by mouth 2 (two) times daily.    Marland Kitchen lisinopril-hydrochlorothiazide (PRINZIDE,ZESTORETIC) 20-12.5 MG per tablet Take 1 tablet by mouth daily.    . methocarbamol (ROBAXIN) 500 MG tablet Take 1 tablet by mouth as needed.    . Multiple Vitamin (MULTIVITAMIN) capsule Take 1 capsule by mouth daily.    Marland Kitchen omeprazole (PRILOSEC) 20 MG capsule Take 20 mg by mouth  daily.    . SitaGLIPtin-MetFORMIN HCl (JANUMET XR) [phone] MG TB24 Take 1 tablet by mouth daily. Reported on 09/07/2015     No current facility-administered medications for this visit.      ALLERGIES: Codeine; Paxil [paroxetine hcl]; and Sulfamethoxazole-trimethoprim  Family History  Problem Relation Age of Onset  . Hypertension Mother   . Brain cancer Mother 70  . Hypertension Father   . Diabetes Father   . Diabetes Brother   . Hypertension Brother   . Lung cancer Brother 77       Lung cancer  . Hypertension Sister   . Hyperlipidemia Sister   . Diabetes Sister   . Hypertension Sister   . Parkinson's disease Sister     Social History   Social History  . Marital status: Married    Spouse name: N/A  . Number of children: N/A  . Years of education: N/A   Occupational History  . Not on file.   Social History  Main Topics  . Smoking status: Never Smoker  . Smokeless tobacco: Never Used  . Alcohol use No  . Drug use: No  . Sexual activity: No   Other Topics Concern  . Not on file   Social History Narrative  . No narrative on file    Review of Systems  Constitutional: Negative.   HENT: Negative.   Eyes: Negative.   Respiratory: Negative.   Cardiovascular: Negative.   Gastrointestinal: Negative.   Genitourinary: Positive for dysuria.       Vaginal cyst  Night urination   Musculoskeletal: Negative.   Skin: Negative.   Neurological: Negative.   Endo/Heme/Allergies: Negative.   Psychiatric/Behavioral: Negative.     PHYSICAL EXAMINATION:    BP 122/60 (BP Location: Right Arm, Patient Position: Sitting, Cuff Size: Normal)   Pulse 84   Resp 16   Wt 156 lb (70.8 kg)   LMP 07/25/1988   BMI 26.57 kg/m     General appearance: alert, cooperative and appears stated age Abdomen: soft, mildly tender in the suprapubic region, no rebound, no guarding; no masses,  no organomegaly CVA: not tender  Pelvic: External genitalia:  Just lateral to the left vulva is a ruptured, healing boil. No significant induration, no surrounding cellulitis                Chaperone was present for exam.  ASSESSMENT Resolving boil, no signs of surrounding infection Treated for UTI last week, symptoms improved but not resolved. Reviewed culture and sensitivities from last week     PLAN Continue warm compresses to the resolving vulvar boil Will send urine for ua, c&s Will start Augmentin 500 mg BID x 5 days for suspected partially treated UTI Call with fevers, flank pain, worsening vulvar discomfort, or any other concerns    An After Visit Summary was printed and given to the patient.  CC: Dr Melford Aase

## 2016-11-29 LAB — URINALYSIS, MICROSCOPIC ONLY: Casts: NONE SEEN /lpf

## 2016-11-29 LAB — URINE CULTURE

## 2016-11-30 ENCOUNTER — Telehealth: Payer: Self-pay

## 2016-11-30 NOTE — Telephone Encounter (Signed)
-----   Message from Jacqueline Dom, MD sent at 11/30/2016  3:26 PM EDT ----- Please call the patient and let her know that her f/u urine culture was negative for infection. She should be able to stop the Augmentin. See if she is feeling any better.

## 2016-11-30 NOTE — Telephone Encounter (Signed)
Called patient at [phone] to discuss results of urine culture and see how patient was feeling, LMOVM to call me. I did leave message stating urine culture negative and okay to stop Augmentin.

## 2016-12-06 NOTE — Telephone Encounter (Signed)
Spoke with patient and she states she is feeling much better and thanked [place] for everything. She will call back if any further problems.

## 2017-09-12 ENCOUNTER — Ambulatory Visit: Payer: BLUE CROSS/BLUE SHIELD | Admitting: Nurse Practitioner

## 2017-09-13 ENCOUNTER — Encounter: Payer: Self-pay | Admitting: Obstetrics and Gynecology

## 2017-09-13 ENCOUNTER — Ambulatory Visit (INDEPENDENT_AMBULATORY_CARE_PROVIDER_SITE_OTHER): Payer: BLUE CROSS/BLUE SHIELD | Admitting: Obstetrics and Gynecology

## 2017-09-13 ENCOUNTER — Other Ambulatory Visit: Payer: Self-pay

## 2017-09-13 VITALS — BP 128/68 | HR 80 | Resp 16 | Ht 64.5 in | Wt 156.0 lb

## 2017-09-13 DIAGNOSIS — N898 Other specified noninflammatory disorders of vagina: Secondary | ICD-10-CM | POA: Diagnosis not present

## 2017-09-13 DIAGNOSIS — Z01419 Encounter for gynecological examination (general) (routine) without abnormal findings: Secondary | ICD-10-CM | POA: Diagnosis not present

## 2017-09-13 NOTE — Patient Instructions (Signed)

## 2017-09-13 NOTE — Progress Notes (Signed)
67 y.o. G38P2002 Married Caucasian female here for annual exam.    ROS - night time urination, can be up to every 2 hours.  Not sure if it is bladder or it is her back causing problems. Has spondylolithesis followed by Dr. Tonita Cong.   PCP:  Dr. Melford Aase   Patient's last menstrual period was 07/25/1988.           Sexually active: No.  The current method of family planning is status post hysterectomy and post menopausal status.    Exercising: Yes.    walking Smoker:  no  Health Maintenance: Pap:  05/29/06, Negative (hysterectomy) History of abnormal Pap:  Yes prior to hysterectomy.  MMG:  09/05/17 BIRADS 1 negative - mobile unit at work.  See Care Everywhere. Colonoscopy:  08/03/14, polyp in the past, repeat in 5 years BMD:   2018  Result  Osteopenia.  Low fracture risk by FRAX model. TDaP:  11/28/10 Gardasil:   n/a HIV and Hep C: 2017 negative Screening Labs: done at work   reports that she has never smoked. She has never used smokeless tobacco. She reports that she does not drink alcohol or use drugs.  Past Medical History:  Diagnosis Date  . Anxiety   . Diabetes mellitus without complication ([place]) 09/4126   type 2  . Hypertension late 62s  . Psoriasis age 2   hair line, pubic, hands    Past Surgical History:  Procedure Laterality Date  . ABDOMINAL HYSTERECTOMY  1990   TAH, AUB, ? endometriosis, cervical dysplasia  . APPENDECTOMY     done at the time of hysterectomy  . BREAST SURGERY Left 1993 & 1995   benign BX  . CERVICAL DISCECTOMY  1994   C 4-5  . INCISION / [place] BONE ELBOW Right 2001  . OPEN REPAIR PERIARTICULAR FRACTURE / DISLOCATION ELBOW Right 2000 for 3 surgeries   secondary to a fall  . SHOULDER DEBRIDEMENT Left 07/2009   bone spur  . TUBAL LIGATION  1986    Current Outpatient Medications  Medication Sig Dispense Refill  . albuterol (PROVENTIL HFA;VENTOLIN HFA) 108 (90 Base) MCG/ACT inhaler as needed.    Marland Kitchen amLODipine (NORVASC) 5 MG tablet Take 5 mg by  mouth daily.    Marland Kitchen atorvastatin (LIPITOR) 20 MG tablet Take 20 mg by mouth daily.    . busPIRone (BUSPAR) 7.5 MG tablet Take 7.5 mg by mouth 2 (two) times daily.     . cetirizine (ZYRTEC) 10 MG tablet Take 10 mg by mouth daily.    . cholecalciferol (VITAMIN D) 1000 UNITS tablet Take 2,000 Units by mouth daily.     Marland Kitchen lisinopril-hydrochlorothiazide (PRINZIDE,ZESTORETIC) 20-12.5 MG per tablet Take 1 tablet by mouth daily.    . methocarbamol (ROBAXIN) 500 MG tablet Take 1 tablet by mouth as needed.    . Multiple Vitamin (MULTIVITAMIN) capsule Take 1 capsule by mouth daily.    Marland Kitchen omeprazole (PRILOSEC) 20 MG capsule Take 20 mg by mouth daily.    . SitaGLIPtin-MetFORMIN HCl (JANUMET XR) [phone] MG TB24 Take 1 tablet by mouth daily. Reported on 09/07/2015     No current facility-administered medications for this visit.     Family History  Problem Relation Age of Onset  . Hypertension Mother   . Brain cancer Mother 90  . Hypertension Father   . Diabetes Father   . Diabetes Brother   . Hypertension Brother   . Lung cancer Brother 8       Lung cancer  .  Hypertension Sister   . Hyperlipidemia Sister   . Diabetes Sister   . Hypertension Sister   . Parkinson's disease Sister     Review of Systems  Constitutional: Negative.   HENT: Negative.   Eyes: Negative.   Respiratory: Negative.   Cardiovascular: Negative.   Gastrointestinal: Negative.   Endocrine: Negative.   Genitourinary: Negative.   Musculoskeletal: Negative.   Skin: Negative.   Allergic/Immunologic: Negative.   Neurological: Negative.   Hematological: Negative.   Psychiatric/Behavioral: Negative.     Exam:   BP 128/68 (BP Location: Right Arm, Patient Position: Sitting, Cuff Size: Normal)   Pulse 80   Resp 16   Ht 5' 4.5" (1.638 m)   Wt 156 lb (70.8 kg)   LMP 07/25/1988   BMI 26.36 kg/m     General appearance: alert, cooperative and appears stated age Head: Normocephalic, without obvious abnormality,  atraumatic Neck: no adenopathy, supple, symmetrical, trachea midline and thyroid normal to inspection and palpation Lungs: clear to auscultation bilaterally Breasts: normal appearance on right and scar on left breast lateral side, no masses or tenderness, No nipple retraction or dimpling, No nipple discharge or bleeding, No axillary or supraclavicular adenopathy Heart: regular rate and rhythm Abdomen: soft, non-tender; no masses, no organomegaly Extremities: extremities normal, atraumatic, no cyanosis or edema Skin: Skin color, texture, turgor normal. No rashes or lesions Lymph nodes: Cervical, supraclavicular, and axillary nodes normal. No abnormal inguinal nodes palpated Neurologic: Grossly normal  Pelvic: External genitalia:  no lesions              Urethra:  normal appearing urethra with no masses, tenderness or lesions              Bartholins and Skenes: normal                 Vagina:   3 - 4 mm red lesion of the posterior mid vaginal wall.  Not raised.               Cervix:  absent              Pap taken: No. Bimanual Exam:  Uterus:  normal size, contour, position, consistency, mobility, non-tender              Adnexa: no mass, fullness, tenderness              Rectal exam: Yes.  .  Confirms.              Anus:  normal sphincter tone, no lesions  Chaperone was present for exam.  Assessment:   Well woman visit with normal exam. Status post TAH.  Ovaries remain.  Remote hx abnormal paps.  Vaginal lesion.  Status post left breast biopsy x 2.  Benign.  Osteopenia.   Plan: Mammogram screening. Recommended self breast awareness. Pap and HR HPV as above. Guidelines for Calcium, Vitamin D, regular exercise program including cardiovascular and weight bearing exercise. Return for colposcopy with biopsy if lesion persists.  Rationale explained.  BMD next year.  Follow up annually and prn.   After visit summary provided.

## 2017-09-17 ENCOUNTER — Telehealth: Payer: Self-pay | Admitting: Obstetrics and Gynecology

## 2017-09-17 NOTE — Telephone Encounter (Signed)
I am sorry that I am unavailable tomorrow at 12:30.  Please schedule for another day with me.

## 2017-09-17 NOTE — Telephone Encounter (Signed)
Spoke with patient. Patient states that she is ready to schedule vaginal colposcopy for vaginal lesion. Requesting an appointment for tomorrow. Patient has upcoming travel plans an would like to have this done asap before leaving. Advised will review with Dr.Silva regarding scheduling and return call.  Dr.Silva, could this patient be seen tomorrow at 12:30 pm?

## 2017-09-17 NOTE — Telephone Encounter (Signed)
Spoke with patient. Appointment for colposcopy scheduled for 09/20/2017 at 10 am with Dr.Silva. Patient is agreeable to date and time.  Routing to provider for final review. Patient agreeable to disposition. Will close encounter.

## 2017-09-17 NOTE — Telephone Encounter (Signed)
Patient is ready to schedule her colposcopy.

## 2017-09-20 ENCOUNTER — Other Ambulatory Visit: Payer: Self-pay

## 2017-09-20 ENCOUNTER — Ambulatory Visit: Payer: BLUE CROSS/BLUE SHIELD | Admitting: Obstetrics and Gynecology

## 2017-09-20 ENCOUNTER — Encounter: Payer: Self-pay | Admitting: Obstetrics and Gynecology

## 2017-09-20 DIAGNOSIS — N898 Other specified noninflammatory disorders of vagina: Secondary | ICD-10-CM

## 2017-09-20 NOTE — Patient Instructions (Signed)
Colposcopy, Care After  This sheet gives you information about how to care for yourself after your procedure. Your doctor may also give you more specific instructions. If you have problems or questions, contact your doctor.  What can I expect after the procedure?  If you did not have a tissue sample removed (did not have a biopsy), you may only have some spotting for a few days. You can go back to your normal activities.  If you had a tissue sample removed, it is common to have:  · Soreness and pain. This may last for a few days.  · Light-headedness.  · Mild bleeding from your vagina or dark-colored, grainy discharge from your vagina. This may last for a few days. You may need to wear a sanitary pad.  · Spotting for at least 48 hours after the procedure.    Follow these instructions at home:  · Take over-the-counter and prescription medicines only as told by your doctor. Ask your doctor what medicines you can start taking again. This is very important if you take blood-thinning medicine.  · Do not drive or use heavy machinery while taking prescription pain medicine.  · For 3 days, or as long as your doctor tells you, avoid:  ? Douching.  ? Using tampons.  ? Having sex.  · If you use birth control (contraception), keep using it.  · Limit activity for the first day after the procedure. Ask your doctor what activities are safe for you.  · It is up to you to get the results of your procedure. Ask your doctor when your results will be ready.  · Keep all follow-up visits as told by your doctor. This is important.  Contact a doctor if:  · You get a skin rash.  Get help right away if:  · You are bleeding a lot from your vagina. It is a lot of bleeding if you are using more than one pad an hour for 2 hours in a row.  · You have clumps of blood (blood clots) coming from your vagina.  · You have a fever.  · You have chills  · You have pain in your lower belly (pelvic area).  · You have signs of infection, such as vaginal  discharge that is:  ? Different than usual.  ? Yellow.  ? Bad-smelling.  · You have very pain or cramps in your lower belly that do not get better with medicine.  · You feel light-headed.  · You feel dizzy.  · You pass out (faint).  Summary  · If you did not have a tissue sample removed (did not have a biopsy), you may only have some spotting for a few days. You can go back to your normal activities.  · If you had a tissue sample removed, it is common to have mild pain and spotting for 48 hours.  · For 3 days, or as long as your doctor tells you, avoid douching, using tampons and having sex.  · Get help right away if you have bleeding, very bad pain, or signs of infection.  This information is not intended to replace advice given to you by your health care provider. Make sure you discuss any questions you have with your health care provider.  Document Released: 08/30/2007 Document Revised: 12/01/2015 Document Reviewed: 12/01/2015  Elsevier Interactive Patient Education © 2018 [employer]

## 2017-09-20 NOTE — Progress Notes (Signed)
GYNECOLOGY  VISIT   HPI: 67 y.o.   Married  Caucasian  female   G2P2002 with Patient's last menstrual period was 07/25/1988.   here for   Vaginal colposcopy.  Hx vaginal and vulvar warts treated in the past.   GYNECOLOGIC HISTORY: Patient's last menstrual period was 07/25/1988. Contraception:  Hysterectomy  Menopausal hormone therapy:  None  Last mammogram:  09-05-17 BIRADS 1 negative  Last pap smear:   05-29-06, negative         OB History    Gravida  2   Para  2   Term  2   Preterm  0   AB  0   Living  2     SAB  0   TAB  0   Ectopic  0   Multiple  0   Live Births  2              Patient Active Problem List   Diagnosis Date Noted  . Diabetes mellitus, type 2 ([place]) 06/11/2012  . HLD (hyperlipidemia) 06/11/2012  . BP (high blood pressure) 06/11/2012    Past Medical History:  Diagnosis Date  . Anxiety   . Condyloma acuminata   . Diabetes mellitus without complication ([place]) 06/345   type 2  . Hypertension late 60s  . Psoriasis age 61   hair line, pubic, hands    Past Surgical History:  Procedure Laterality Date  . ABDOMINAL HYSTERECTOMY  1990   TAH, AUB, ? endometriosis, cervical dysplasia  . APPENDECTOMY     done at the time of hysterectomy  . BREAST SURGERY Left 1993 & 1995   benign BX  . CERVICAL DISCECTOMY  1994   C 4-5  . INCISION / Home BONE ELBOW Right 2001  . OPEN REPAIR PERIARTICULAR FRACTURE / DISLOCATION ELBOW Right 2000 for 3 surgeries   secondary to a fall  . SHOULDER DEBRIDEMENT Left 07/2009   bone spur  . TUBAL LIGATION  1986    Current Outpatient Medications  Medication Sig Dispense Refill  . albuterol (PROVENTIL HFA;VENTOLIN HFA) 108 (90 Base) MCG/ACT inhaler as needed.    Marland Kitchen amLODipine (NORVASC) 5 MG tablet Take 5 mg by mouth daily.    Marland Kitchen atorvastatin (LIPITOR) 20 MG tablet Take 20 mg by mouth daily.    . busPIRone (BUSPAR) 7.5 MG tablet Take 7.5 mg by mouth 2 (two) times daily.     . cetirizine (ZYRTEC) 10 MG  tablet Take 10 mg by mouth daily.    . cholecalciferol (VITAMIN D) 1000 UNITS tablet Take 2,000 Units by mouth daily.     Marland Kitchen lisinopril-hydrochlorothiazide (PRINZIDE,ZESTORETIC) 20-12.5 MG per tablet Take 1 tablet by mouth daily.    . methocarbamol (ROBAXIN) 500 MG tablet Take 1 tablet by mouth as needed.    . Multiple Vitamin (MULTIVITAMIN) capsule Take 1 capsule by mouth daily.    Marland Kitchen omeprazole (PRILOSEC) 20 MG capsule Take 20 mg by mouth daily.    . SitaGLIPtin-MetFORMIN HCl (JANUMET XR) [phone] MG TB24 Take 1 tablet by mouth daily. Reported on 09/07/2015     No current facility-administered medications for this visit.      ALLERGIES: Codeine; Paxil [paroxetine hcl]; and Sulfamethoxazole-trimethoprim  Family History  Problem Relation Age of Onset  . Hypertension Mother   . Brain cancer Mother 50  . Hypertension Father   . Diabetes Father   . Diabetes Brother   . Hypertension Brother   . Lung cancer Brother 54  Lung cancer  . Hypertension Sister   . Hyperlipidemia Sister   . Diabetes Sister   . Hypertension Sister   . Parkinson's disease Sister     Social History   Socioeconomic History  . Marital status: Married    Spouse name: Not on file  . Number of children: Not on file  . Years of education: Not on file  . Highest education level: Not on file  Occupational History  . Not on file  Social Needs  . Financial resource strain: Not on file  . Food insecurity:    Worry: Not on file    Inability: Not on file  . Transportation needs:    Medical: Not on file    Non-medical: Not on file  Tobacco Use  . Smoking status: Never Smoker  . Smokeless tobacco: Never Used  Substance and Sexual Activity  . Alcohol use: No  . Drug use: No  . Sexual activity: Not Currently    Partners: Male    Birth control/protection: Post-menopausal  Lifestyle  . Physical activity:    Days per week: Not on file    Minutes per session: Not on file  . Stress: Not on file   Relationships  . Social connections:    Talks on phone: Not on file    Gets together: Not on file    Attends religious service: Not on file    Active member of club or organization: Not on file    Attends meetings of clubs or organizations: Not on file    Relationship status: Not on file  . Intimate partner violence:    Fear of current or ex partner: Not on file    Emotionally abused: Not on file    Physically abused: Not on file    Forced sexual activity: Not on file  Other Topics Concern  . Not on file  Social History Narrative  . Not on file    Review of Systems  Constitutional: Negative.   HENT: Positive for sinus pain.   Eyes: Negative.   Respiratory: Negative.   Cardiovascular: Negative.   Gastrointestinal: Negative.   Endocrine: Negative.   Genitourinary: Negative.   Musculoskeletal: Negative.   Skin: Negative.   Allergic/Immunologic: Negative.   Neurological: Negative.   Hematological: Negative.   Psychiatric/Behavioral: Negative.     PHYSICAL EXAMINATION:    BP (!) 144/70 (BP Location: Right Arm, Patient Position: Sitting, Cuff Size: Normal)   Pulse 92   Resp 14   Ht 5' 4.5" (1.638 m)   Wt 157 lb 6.4 oz (71.4 kg)   LMP 07/25/1988   BMI 26.60 kg/m     General appearance: alert, cooperative and appears stated age   Colposcopy of vagina Consent for procedure.  3% acetic acid used.  Prominent atrophy appreciated.  3 - 4 mm of erythema of mid posterior vagina noted and biopsy taken and sent to pathology.   This area did not look like atrophy.  This is the lesion I noted at the patient's recent routine examination.  Lugol's placed in the vagina and decreased uptake noted in the left vaginal apex.  Biopsy taken and sent to pathology.   Monsel's placed.  Minimal EBL.  No complications.                Chaperone was present for exam.  ASSESSMENT   Vaginal lesions.  Remote history of hysterectomy for endometriosis and cervical dysplasia.  Vaginal  atrophy.   PLAN  FU biopsy results. Post  procedure precautions given. Final plan to follow.    An After Visit Summary was printed and given to the patient.

## 2018-07-29 DIAGNOSIS — K219 Gastro-esophageal reflux disease without esophagitis: Secondary | ICD-10-CM | POA: Insufficient documentation

## 2018-09-20 ENCOUNTER — Other Ambulatory Visit: Payer: Self-pay

## 2018-09-20 ENCOUNTER — Ambulatory Visit: Payer: BLUE CROSS/BLUE SHIELD | Admitting: Obstetrics and Gynecology

## 2018-09-24 ENCOUNTER — Ambulatory Visit: Payer: BC Managed Care – PPO | Admitting: Obstetrics and Gynecology

## 2018-09-24 ENCOUNTER — Other Ambulatory Visit: Payer: Self-pay

## 2018-09-24 ENCOUNTER — Encounter: Payer: Self-pay | Admitting: Obstetrics and Gynecology

## 2018-09-24 VITALS — BP 110/70 | HR 88 | Temp 97.4°F | Resp 12 | Ht 64.0 in | Wt 154.0 lb

## 2018-09-24 DIAGNOSIS — Z01419 Encounter for gynecological examination (general) (routine) without abnormal findings: Secondary | ICD-10-CM

## 2018-09-24 NOTE — Patient Instructions (Signed)

## 2018-09-24 NOTE — Progress Notes (Signed)
68 y.o. G12P2002 Married Caucasian female here for annual exam.    Hot flashes come and go.  Gets up a lot at night to void.  Normal bowel function.   Had benign vaginal biopsies last year.   Working during the pandemic.   Will see PCP in about one month.  She will have blood work done at her work and then sent to her PCP.  Last A1C about 8.2.  PCP: Anastasia Pall, MD    Patient's last menstrual period was 07/25/1988.           Sexually active: No.  The current method of family planning is status post hysterectomy and post menopausal status.    Exercising: Yes.    walking and bike Smoker:  no  Health Maintenance: Pap:   05/29/06, Negative(hysterectomy) History of abnormal Pap:  Years, prior to having hysterectomy MMG:  09/16/18 BIRADS 2 benign -- 3D Bilateral MMG Colonoscopy:  08/03/14,polyp in the past,repeat in 5 years BMD:   2018  Result  Osteopenia. Low fracture risk by FRAX model TDaP:  2012 HIV and Hep C: 2017 negative Screening Labs: done at work   reports that she has never smoked. She has never used smokeless tobacco. She reports that she does not drink alcohol or use drugs.  Past Medical History:  Diagnosis Date  . Anxiety   . Condyloma acuminata   . Diabetes mellitus without complication ([place]) 04/5425   type 2  . Hypertension late 86s  . Psoriasis age 79   hair line, pubic, hands    Past Surgical History:  Procedure Laterality Date  . ABDOMINAL HYSTERECTOMY  1990   TAH, AUB, ? endometriosis, cervical dysplasia  . APPENDECTOMY     done at the time of hysterectomy  . BREAST SURGERY Left 1993 & 1995   benign BX  . CERVICAL DISCECTOMY  1994   C 4-5  . INCISION / [place] BONE ELBOW Right 2001  . OPEN REPAIR PERIARTICULAR FRACTURE / DISLOCATION ELBOW Right 2000 for 3 surgeries   secondary to a fall  . SHOULDER DEBRIDEMENT Left 07/2009   bone spur  . TUBAL LIGATION  1986    Current Outpatient Medications  Medication Sig Dispense Refill  .  albuterol (PROVENTIL HFA;VENTOLIN HFA) 108 (90 Base) MCG/ACT inhaler as needed.    Marland Kitchen amLODipine (NORVASC) 5 MG tablet Take 5 mg by mouth daily.    Marland Kitchen atorvastatin (LIPITOR) 20 MG tablet Take 20 mg by mouth daily.    . busPIRone (BUSPAR) 7.5 MG tablet Take 7.5 mg by mouth 2 (two) times daily.     . cetirizine (ZYRTEC) 10 MG tablet Take 10 mg by mouth daily.    . cholecalciferol (VITAMIN D) 1000 UNITS tablet Take 2,000 Units by mouth daily.     Marland Kitchen lisinopril-hydrochlorothiazide (PRINZIDE,ZESTORETIC) 20-12.5 MG per tablet Take 1 tablet by mouth daily.    . methocarbamol (ROBAXIN) 500 MG tablet Take 1 tablet by mouth as needed.    . Multiple Vitamin (MULTIVITAMIN) capsule Take 1 capsule by mouth daily.    Marland Kitchen omeprazole (PRILOSEC) 20 MG capsule Take 20 mg by mouth daily.    . SitaGLIPtin-MetFORMIN HCl (JANUMET XR) [phone] MG TB24 Take 1 tablet by mouth daily. Reported on 09/07/2015     No current facility-administered medications for this visit.     Family History  Problem Relation Age of Onset  . Hypertension Mother   . Brain cancer Mother 55  . Hypertension Father   . Diabetes Father   .  Diabetes Brother   . Hypertension Brother   . Lung cancer Brother 67       Lung cancer  . Hypertension Sister   . Hyperlipidemia Sister   . Diabetes Sister   . Hypertension Sister   . Parkinson's disease Sister     Review of Systems  Constitutional: Negative.   HENT: Negative.   Eyes: Negative.   Respiratory: Negative.   Cardiovascular: Negative.   Gastrointestinal: Negative.   Endocrine: Negative.   Genitourinary: Negative.   Musculoskeletal: Negative.   Skin: Negative.   Allergic/Immunologic: Negative.   Neurological: Negative.   Hematological: Negative.   Psychiatric/Behavioral: Negative.     Exam:   BP 110/70 (BP Location: Left Arm, Patient Position: Sitting, Cuff Size: Normal)   Pulse 88   Temp (!) 97.4 F (36.3 C) (Temporal)   Resp 12   Ht 5\' 4"  (1.626 m)   Wt 154 lb (69.9 kg)    LMP 07/25/1988   BMI 26.43 kg/m     General appearance: alert, cooperative and appears stated age Head: normocephalic, without obvious abnormality, atraumatic Neck: no adenopathy, supple, symmetrical, trachea midline and thyroid normal to inspection and palpation Lungs: clear to auscultation bilaterally Breasts: normal appearance, no masses or tenderness, No nipple retraction or dimpling, No nipple discharge or bleeding, No axillary adenopathy Heart: regular rate and rhythm Abdomen: soft, non-tender; no masses, no organomegaly Extremities: extremities normal, atraumatic, no cyanosis or edema Skin: skin color, texture, turgor normal. No rashes or lesions Lymph nodes: cervical, supraclavicular, and axillary nodes normal. Neurologic: grossly normal  Pelvic: External genitalia:  no lesions              No abnormal inguinal nodes palpated.              Urethra:  normal appearing urethra with no masses, tenderness or lesions              Bartholins and Skenes: normal                 Vagina: normal appearing vagina with normal color and discharge, no lesions              Cervix:  absent              Pap taken: No. Bimanual Exam:  Uterus: absent              Adnexa: no mass, fullness, tenderness              Rectal exam: Yes.  .  Confirms.              Anus:  normal sphincter tone, no lesions  Chaperone was present for exam.  Assessment:   Well woman visit with normal exam. Status post TAH.  Ovaries remain.  Remote hx abnormal paps.  Vaginal lesion in 2019.  Biopsies benign.  Status post left breast biopsy x 2.  Benign.  Osteopenia.   Plan: Mammogram screening discussed. Self breast awareness reviewed. Pap and HR HPV as above. Guidelines for Calcium, Vitamin D, regular exercise program including cardiovascular and weight bearing exercise. She will do BMD this year through her PCP. Follow up annually and prn.   After visit summary provided.

## 2018-11-26 DIAGNOSIS — J302 Other seasonal allergic rhinitis: Secondary | ICD-10-CM | POA: Insufficient documentation

## 2019-05-24 ENCOUNTER — Ambulatory Visit: Payer: Self-pay | Attending: Internal Medicine

## 2019-05-24 DIAGNOSIS — Z23 Encounter for immunization: Secondary | ICD-10-CM | POA: Insufficient documentation

## 2019-05-24 NOTE — Progress Notes (Signed)
   Covid-19 Vaccination Clinic  Name:  Jacqueline Cox    MRN: [id number] DOB: Apr 13, 1950  05/24/2019  Jacqueline Cox was observed post Covid-19 immunization for 30 minutes based on pre-vaccination screening without incidence. She was provided with Vaccine Information Sheet and instruction to access the V-Safe system.   Jacqueline Cox was instructed to call 911 with any severe reactions post vaccine: Marland Kitchen Difficulty breathing  . Swelling of your face and throat  . A fast heartbeat  . A bad rash all over your body  . Dizziness and weakness    Immunizations Administered    Name Date Dose VIS Date Route   Pfizer COVID-19 Vaccine 05/24/2019  4:46 PM 0.3 mL 03/07/2019 Intramuscular   Manufacturer: [place]   Lot: [id number]   [place]: [id number]

## 2019-08-15 ENCOUNTER — Ambulatory Visit: Payer: Self-pay | Admitting: Physician Assistant

## 2019-09-25 ENCOUNTER — Ambulatory Visit: Payer: BC Managed Care – PPO | Admitting: Obstetrics and Gynecology

## 2019-10-01 ENCOUNTER — Other Ambulatory Visit: Payer: Self-pay

## 2019-10-01 ENCOUNTER — Ambulatory Visit (INDEPENDENT_AMBULATORY_CARE_PROVIDER_SITE_OTHER): Payer: Medicare HMO | Admitting: Obstetrics and Gynecology

## 2019-10-01 ENCOUNTER — Encounter: Payer: Self-pay | Admitting: Obstetrics and Gynecology

## 2019-10-01 VITALS — BP 140/64 | HR 70 | Resp 16 | Ht 64.0 in | Wt 154.0 lb

## 2019-10-01 DIAGNOSIS — Z01419 Encounter for gynecological examination (general) (routine) without abnormal findings: Secondary | ICD-10-CM

## 2019-10-01 NOTE — Patient Instructions (Signed)

## 2019-10-01 NOTE — Progress Notes (Signed)
69 y.o. G76P2002 Married Caucasian female here for annual exam.    No concerns.   Last A1C 7.4 in Sept 2020.   Husband just had shoulder surgery.   Retired after working for a company for 44 years.   Patient and husband had their Covid vaccines.   PCP:   Anastasia Pall, MD  Patient's last menstrual period was 07/25/1988.           Sexually active: No. Husband hx prostate cancer The current method of family planning is post menopausal status.    Exercising: No.  The patient does not participate in regular exercise at present. Smoker:  no  Health Maintenance: Pap: 05-29-06 Neg History of abnormal Pap:  Yes, Years prior to having hysterectomy MMG:  07-30-19 w/[facility] - 3D - Density rating B - BI-RADS 2.  Colonoscopy:  08/03/14,polyp in the past,repeat in 5 years.  Dr. Watt Climes.  BMD: 2018  Result : Osteopenia. Low fracture risk by FRAX model - [facility].  TDaP:  2012 Gardasil:   no HIV: 2017 NR  Hep C: 2017 Neg Screening Labs:  PCP.    reports that she has never smoked. She has never used smokeless tobacco. She reports that she does not drink alcohol and does not use drugs.  Past Medical History:  Diagnosis Date  . Anxiety   . Condyloma acuminata   . Diabetes mellitus without complication ([place]) 09/8673   type 2  . Hypertension late 37s  . Psoriasis age 17   hair line, pubic, hands    Past Surgical History:  Procedure Laterality Date  . ABDOMINAL HYSTERECTOMY  1990   TAH, AUB, ? endometriosis, cervical dysplasia  . APPENDECTOMY     done at the time of hysterectomy  . BREAST SURGERY Left 1993 & 1995   benign BX  . CERVICAL DISCECTOMY  1994   C 4-5  . INCISION / [place] BONE ELBOW Right 2001  . OPEN REPAIR PERIARTICULAR FRACTURE / DISLOCATION ELBOW Right 2000 for 3 surgeries   secondary to a fall  . SHOULDER DEBRIDEMENT Left 07/2009   bone spur  . TUBAL LIGATION  1986    Current Outpatient Medications  Medication Sig Dispense Refill  . albuterol (PROVENTIL  HFA;VENTOLIN HFA) 108 (90 Base) MCG/ACT inhaler as needed.    Marland Kitchen amLODipine (NORVASC) 5 MG tablet Take 5 mg by mouth daily.    Marland Kitchen atorvastatin (LIPITOR) 40 MG tablet Take 40 mg by mouth daily.    . busPIRone (BUSPAR) 15 MG tablet     . cetirizine (ZYRTEC) 10 MG tablet Take 10 mg by mouth daily.    . cholecalciferol (VITAMIN D) 1000 UNITS tablet Take 2,000 Units by mouth daily.     Marland Kitchen lisinopril-hydrochlorothiazide (ZESTORETIC) 20-12.5 MG tablet Take 1 tablet by mouth daily.    . methocarbamol (ROBAXIN) 500 MG tablet Take 1 tablet by mouth as needed.    . Multiple Vitamin (MULTIVITAMIN) capsule Take 1 capsule by mouth daily.    Marland Kitchen omeprazole (PRILOSEC) 20 MG capsule Take 20 mg by mouth daily.    . pregabalin (LYRICA) 75 MG capsule Take 1 capsule by mouth daily.    . SitaGLIPtin-MetFORMIN HCl (JANUMET XR) [phone] MG TB24 Take by mouth.     No current facility-administered medications for this visit.    Family History  Problem Relation Age of Onset  . Hypertension Mother   . Brain cancer Mother 53  . Hypertension Father   . Diabetes Father   . Diabetes Brother   .  Hypertension Brother   . Lung cancer Brother 4       Lung cancer  . Hypertension Sister   . Hyperlipidemia Sister   . Diabetes Sister   . Hypertension Sister   . Parkinson's disease Sister     Review of Systems  All other systems reviewed and are negative.   Exam:   BP 140/64   Pulse 70   Resp 16   Ht 5\' 4"  (1.626 m)   Wt 154 lb (69.9 kg)   LMP 07/25/1988   BMI 26.43 kg/m     General appearance: alert, cooperative and appears stated age Head: normocephalic, without obvious abnormality, atraumatic Neck: no adenopathy, supple, symmetrical, trachea midline and thyroid normal to inspection and palpation Lungs: clear to auscultation bilaterally Breasts: normal appearance on right, circumferential scar around left lateral areola, no masses or tenderness, No nipple retraction or dimpling, No nipple discharge or  bleeding, No axillary adenopathy Heart: regular rate and rhythm Abdomen: soft, non-tender; no masses, no organomegaly Extremities: extremities normal, atraumatic, no cyanosis or edema Skin: skin color, texture, turgor normal. No rashes or lesions Lymph nodes: cervical, supraclavicular, and axillary nodes normal. Neurologic: grossly normal  Pelvic: External genitalia:  no lesions              No abnormal inguinal nodes palpated.              Urethra:  normal appearing urethra with no masses, tenderness or lesions              Bartholins and Skenes: normal                 Vagina: normal appearing vagina with normal color and discharge, no lesions              Cervix: absent              Pap taken: No. Bimanual Exam:  Uterus:  absent              Adnexa: no mass, fullness, tenderness              Rectal exam: Yes.  .  Confirms.              Anus:  normal sphincter tone, no lesions  Chaperone was present for exam.  Assessment:   Well woman visit with normal exam. Status post TAH. Ovaries remain.  Remote hx abnormal paps.  Vaginal lesion in 2019.  Biopsies benign.  Status post left breast biopsy x 2. Benign.  Osteopenia.  Plan: Mammogram screening discussed. Self breast awareness reviewed. Pap and HR HPV as above. Guidelines for Calcium, Vitamin D, regular exercise program including cardiovascular and weight bearing exercise. She will pursue a [facility] this year [facility] through her PCP.  Colonoscopy is due.  She will schedule. Follow up annually and prn.   After visit summary provided.

## 2019-10-21 ENCOUNTER — Ambulatory Visit: Payer: Self-pay | Admitting: Physician Assistant

## 2019-10-23 ENCOUNTER — Ambulatory Visit: Payer: Self-pay | Admitting: Physician Assistant

## 2019-11-05 ENCOUNTER — Ambulatory Visit: Payer: Medicare HMO | Admitting: Physician Assistant

## 2019-11-05 ENCOUNTER — Encounter: Payer: Self-pay | Admitting: Physician Assistant

## 2019-11-05 ENCOUNTER — Other Ambulatory Visit: Payer: Self-pay

## 2019-11-05 DIAGNOSIS — Z85828 Personal history of other malignant neoplasm of skin: Secondary | ICD-10-CM | POA: Diagnosis not present

## 2019-11-05 DIAGNOSIS — D485 Neoplasm of uncertain behavior of skin: Secondary | ICD-10-CM | POA: Diagnosis not present

## 2019-11-05 DIAGNOSIS — L570 Actinic keratosis: Secondary | ICD-10-CM | POA: Diagnosis not present

## 2019-11-05 DIAGNOSIS — Z86018 Personal history of other benign neoplasm: Secondary | ICD-10-CM

## 2019-11-05 DIAGNOSIS — Z1283 Encounter for screening for malignant neoplasm of skin: Secondary | ICD-10-CM

## 2019-11-05 NOTE — Patient Instructions (Signed)

## 2019-11-05 NOTE — Progress Notes (Signed)
   Follow up Visit  Subjective  Jacqueline Cox is a 69 y.o. female who presents for the following: Annual Exam (history of cancer on face with [facility]). Small scab left cheek that stays and has been there 4-5 months. It isn't sore. Spot on left calf that is new and has only been present for a couple months.   Objective  Well appearing patient in no apparent distress; mood and affect are within normal limits.  A full examination was performed including head, eyes, ears, nose, lips, neck, chest, axillae, abdomen, back, buttocks, bilateral upper extremities, bilateral lower extremities, hands, feet, fingers, toes, fingernails, and toenails. All findings within normal limits unless otherwise noted below. No suspicious moles noted on back.   Objective  Left Ankle - Anterior (2), Left Buccal Cheek : Erythematous patches with gritty scale.  Objective  Right Lower Leg - Anterior: Pink papule with crust      Objective  Chest - [facility]): Total body skin exam  Assessment & Plan  Actinic keratosis (3) Left Ankle - Anterior (2); Left Buccal Cheek   Destruction of lesion - Left Ankle - Anterior, Left Buccal Cheek  Complexity: simple   Destruction method: cryotherapy   Informed consent: discussed and consent obtained   Timeout:  patient name, date of birth, surgical site, and procedure verified Lesion destroyed using liquid nitrogen: Yes   Outcome: patient tolerated procedure well with no complications    Neoplasm of uncertain behavior of skin Right Lower Leg - Anterior  Skin / nail biopsy Type of biopsy: tangential   Informed consent: discussed and consent obtained   Timeout: patient name, date of birth, surgical site, and procedure verified   Procedure prep:  Patient was prepped and draped in usual sterile fashion (Non sterile) Prep type:  Chlorhexidine Anesthesia: the lesion was anesthetized in a standard fashion   Anesthetic:  1% lidocaine w/ epinephrine 1-100,000 local  infiltration Instrument used: flexible razor blade   Outcome: patient tolerated procedure well   Post-procedure details: wound care instructions given    Specimen 1 - Surgical pathology Differential Diagnosis: scc Check Margins: No  History of basal cell carcinoma (BCC) (2) Right Nasal Sidewall; Left Melolabial Fold  History of dysplastic nevus (3) Right Foot - Posterior; Right Upper Arm - Anterior; Right Thigh - Anterior  Skin exam for malignant neoplasm Chest - Medial (Center)

## 2020-07-09 ENCOUNTER — Other Ambulatory Visit: Payer: Self-pay

## 2020-07-09 ENCOUNTER — Encounter (INDEPENDENT_AMBULATORY_CARE_PROVIDER_SITE_OTHER): Payer: Self-pay | Admitting: Ophthalmology

## 2020-07-09 ENCOUNTER — Ambulatory Visit (INDEPENDENT_AMBULATORY_CARE_PROVIDER_SITE_OTHER): Payer: Medicare HMO | Admitting: Ophthalmology

## 2020-07-09 DIAGNOSIS — H3581 Retinal edema: Secondary | ICD-10-CM

## 2020-07-09 DIAGNOSIS — I10 Essential (primary) hypertension: Secondary | ICD-10-CM

## 2020-07-09 DIAGNOSIS — Z961 Presence of intraocular lens: Secondary | ICD-10-CM

## 2020-07-09 DIAGNOSIS — H43813 Vitreous degeneration, bilateral: Secondary | ICD-10-CM

## 2020-07-09 DIAGNOSIS — E119 Type 2 diabetes mellitus without complications: Secondary | ICD-10-CM

## 2020-07-09 DIAGNOSIS — H35033 Hypertensive retinopathy, bilateral: Secondary | ICD-10-CM

## 2020-07-09 NOTE — Progress Notes (Signed)
[facility] Note  07/09/2020     CHIEF COMPLAINT Patient presents for Retina Evaluation   HISTORY OF PRESENT ILLNESS: Jacqueline Cox is a 70 y.o. female who presents to the clinic today for:   HPI    Retina Evaluation    In both eyes.  Associated Symptoms Floaters.  I, the attending physician,  performed the HPI with the patient and updated documentation appropriately.          Comments    Patient here for Retina Evaluation. Referred by Dr Anthony Sar. Patient states vision can't tell any difference since yesterday when saw doctor. Still sees in OD yellow looking spot. No eye pain. Started 2 days ago when reading noticed spot.       Last edited by Bernarda Caffey, MD on 07/12/2020  9:10 AM. (History)    pt states 2 nights ago she was reading and she noticed a yellow shaped floater in her right eye, she states there was no pain associated with it, pt went to see Dr. Anthony Sar yesterday morning who told the pt she has swelling on her retina, pt states after she saw Dr. Marin Comment yesterday she saw black, spiderweb floaters in her vision, which have since gone away, pt states this morning when checking her vision the yellow floater centers right on the letters and it's hard to see around it, pt states 4 weeks ago she had a home health nurse take pictures of her retina and everything was fine at that point, pt had cataract sx with Dr. Zenia Resides a few years ago  Referring physician: Harlen Labs, MD [address] [place],  [place] [address]  HISTORICAL INFORMATION:   Selected notes from the MEDICAL RECORD NUMBER Referred by Dr. Truman Hayward:  Ocular Hx- PMH-    CURRENT MEDICATIONS: No current outpatient medications on file. (Ophthalmic Drugs)   No current facility-administered medications for this visit. (Ophthalmic Drugs)   Current Outpatient Medications (Other)  Medication Sig  . albuterol (PROVENTIL HFA;VENTOLIN HFA) 108 (90 Base) MCG/ACT inhaler as needed.  Marland Kitchen amLODipine  (NORVASC) 5 MG tablet Take 5 mg by mouth daily.  Marland Kitchen atorvastatin (LIPITOR) 40 MG tablet Take 40 mg by mouth daily.  . busPIRone (BUSPAR) 15 MG tablet   . cetirizine (ZYRTEC) 10 MG tablet Take 10 mg by mouth daily.  . cholecalciferol (VITAMIN D) 1000 UNITS tablet Take 2,000 Units by mouth daily.   Marland Kitchen lisinopril-hydrochlorothiazide (ZESTORETIC) 20-12.5 MG tablet Take 1 tablet by mouth daily.  . methocarbamol (ROBAXIN) 500 MG tablet Take 1 tablet by mouth as needed.  . Multiple Vitamin (MULTIVITAMIN) capsule Take 1 capsule by mouth daily.  Marland Kitchen omeprazole (PRILOSEC) 20 MG capsule Take 20 mg by mouth daily.  . pregabalin (LYRICA) 75 MG capsule Take 1 capsule by mouth daily.  . SitaGLIPtin-MetFORMIN HCl (JANUMET XR) [phone] MG TB24 Take by mouth.   No current facility-administered medications for this visit. (Other)      REVIEW OF SYSTEMS: ROS    Positive for: Endocrine, Eyes   Last edited by Theodore Demark, COA on 07/09/2020  9:57 AM. (History)       ALLERGIES Allergies  Allergen Reactions  . Codeine Other (See Comments)    unknown  . Paxil [Paroxetine Hcl] Other (See Comments)    unknown  . Sulfamethoxazole-Trimethoprim Rash    PAST MEDICAL HISTORY Past Medical History:  Diagnosis Date  . Anxiety   . Atypical mole 05/20/2003  Right Foot, Dorsum (slight)  . Atypical mole 07/20/2003   Right Upper Arm (Minimal)  . Atypical mole 07/20/2003   Right Shin (Mild)  . Atypical mole 09/24/2012   Right Ant. Thigh (Severe) (widershave)  . Atypical mole 10/10/2012   Right Thigh (Mild)  . BCC (basal cell carcinoma of skin) 09/24/2002   Upper Left Lip (MOH's)  . Condyloma acuminata   . Diabetes mellitus without complication ([place]) 0/8676   type 2  . Hypertension late 29s  . Psoriasis age 61   hair line, pubic, hands  . Superficial basal cell carcinoma (BCC) 06/20/2011   Right Side Nose ([place])  . Superficial basal cell carcinoma (BCC) 06/02/2014   Right Side Nose   Past  Surgical History:  Procedure Laterality Date  . ABDOMINAL HYSTERECTOMY  1990   TAH, AUB, ? endometriosis, cervical dysplasia  . APPENDECTOMY     done at the time of hysterectomy  . BREAST SURGERY Left 1993 & 1995   benign BX  . CERVICAL DISCECTOMY  1994   C 4-5  . INCISION / [place] BONE ELBOW Right 2001  . OPEN REPAIR PERIARTICULAR FRACTURE / DISLOCATION ELBOW Right 2000 for 3 surgeries   secondary to a fall  . SHOULDER DEBRIDEMENT Left 07/2009   bone spur  . TUBAL LIGATION  1986    FAMILY HISTORY Family History  Problem Relation Age of Onset  . Hypertension Mother   . Brain cancer Mother 88  . Hypertension Father   . Diabetes Father   . Diabetes Brother   . Hypertension Brother   . Lung cancer Brother 44       Lung cancer  . Hypertension Sister   . Hyperlipidemia Sister   . Diabetes Sister   . Hypertension Sister   . Parkinson's disease Sister     SOCIAL HISTORY Social History   Tobacco Use  . Smoking status: Never Smoker  . Smokeless tobacco: Never Used  Vaping Use  . Vaping Use: Never used  Substance Use Topics  . Alcohol use: No  . Drug use: No         OPHTHALMIC EXAM:  Base Eye Exam    Visual Acuity (Snellen - Linear)      Right Left   Dist [place] 20/30 -1 20/20   Dist ph [place] NI        Tonometry (Tonopen, 9:47 AM)      Right Left   Pressure 17 16       Pupils      Dark Light Shape React APD   Right 2 1 Round Minimal None   Left 2 1 Round Minimal None       Visual Fields (Counting fingers)      Left Right    Full Full       Extraocular Movement      Right Left    Full, Ortho Full, Ortho       Neuro/Psych    Oriented x3: Yes   Mood/Affect: Normal       Dilation    Both eyes: 1.0% Mydriacyl, 2.5% Phenylephrine @ 9:47 AM        Slit Lamp and Fundus Exam    Slit Lamp Exam      Right Left   Lids/Lashes Dermatochalasis - upper lid, Meibomian gland dysfunction Dermatochalasis - upper lid, Meibomian gland dysfunction    Conjunctiva/Sclera White and quiet White and quiet   Cornea Mild arcus, 1-2+ Punctate epithelial erosions, EBMD, well healed temporal cataract  wounds Mild arcus, 1-2+ Punctate epithelial erosions, trace EBMD, well healed temporal cataract wounds   Anterior Chamber Deep and quiet Deep and quiet   Iris Round and dilated, No NVI Round and dilated, No NVI   Lens PC IOL in good position Toric PC IOL in good position with marks at 0100 and 0700   Vitreous Vitreous syneresis, Posterior vitreous detachment, prominent vitreous condensations settling inferiorly Vitreous syneresis, Posterior vitreous detachment, vitreous condensations       Fundus Exam      Right Left   Disc mild Pallor, mild tilt, temporal PPA/PPP Trace Pallor, mild tilt, Sharp rim, temporal PPA   C/D Ratio 0.2 0.2   Macula Flat, Blunted foveal reflex, RPE mottling and clumping, RPE atrophy nasal mac, No heme or edema Flat, Blunted foveal reflex, RPE mottling and clumping, No heme or edema   Vessels attenuated, mild tortuousity attenuated, mild tortuousity   Periphery Attached, No RT/RD Attached, pigmented CR scar at 0200           Refraction    Manifest Refraction (Auto)      Sphere Cylinder Axis Dist VA   Right -0.75 +1.50 139 20/25   Left +0.00 +0.25 077 20/20          IMAGING AND PROCEDURES  Imaging and Procedures for 07/09/2020  OCT, Retina - OU - Both Eyes       Right Eye Quality was good. Central Foveal Thickness: 284. Progression has no prior data. Findings include normal foveal contour, no IRF, no SRF, myopic contour (Partial PVD).   Left Eye Quality was good. Central Foveal Thickness: 308. Progression has no prior data. Findings include normal foveal contour, no IRF, no SRF, vitreomacular adhesion .   Notes *Images captured and stored on drive  Diagnosis / Impression:  NFP; no IRF/SRF OU OD -- Partial PVD  Clinical management:  See below  Abbreviations: NFP - Normal foveal profile. CME - cystoid  macular edema. PED - pigment epithelial detachment. IRF - intraretinal fluid. SRF - subretinal fluid. EZ - ellipsoid zone. ERM - epiretinal membrane. ORA - outer retinal atrophy. ORT - outer retinal tubulation. SRHM - subretinal hyper-reflective material. IRHM - intraretinal hyper-reflective material               ASSESSMENT/PLAN:    ICD-10-CM   1. Posterior vitreous detachment of both eyes  H43.813   2. Retinal edema  H35.81 OCT, Retina - OU - Both Eyes  3. Diabetes mellitus type 2 without retinopathy ([place])  E11.9   4. Essential hypertension  I10   5. Hypertensive retinopathy of both eyes  H35.033   6. Pseudophakia, both eyes  Z96.1     1,2. PVD / vitreous syneresis OU  - OD with acute symptomatic floaters x2 days  - OS is asymptomatic   - Discussed findings and prognosis  - No RT or RD on 360 scleral depressed exam  - Reviewed s/s of RT/RD  - Strict return precautions for any such RT/RD signs/symptoms  - f/u in 4 wks -- DFE/OCT  3. Diabetes mellitus, type 2 without retinopathy - The incidence, risk factors for progression, natural history and treatment options for diabetic retinopathy  were discussed with patient.   - The need for close monitoring of blood glucose, blood pressure, and serum lipids, avoiding cigarette or any type of tobacco, and the need for long term follow up was also discussed with patient. - f/u in 1 year, sooner prn  4,5. Hypertensive retinopathy OU -  discussed importance of tight BP control - monitor  6. Pseudophakia OU  - s/p CE/IOL (Dr. Zenia Resides)  - IOL in good position, doing well - monitor  Ophthalmic Meds Ordered this visit:  No orders of the defined types were placed in this encounter.      Return in about 4 weeks (around 08/06/2020) for f/u PVD OD, DFE, OCT.  There are no Patient Instructions on file for this visit.   Explained the diagnoses, plan, and follow up with the patient and they expressed understanding.  Patient expressed  understanding of the importance of proper follow up care.   This document serves as a record of services personally performed by Gardiner Sleeper, MD, PhD. It was created on their behalf by San Jetty. Owens Shark, OA an ophthalmic technician. The creation of this record is the provider's dictation and/or activities during the visit.    Electronically signed by: San Jetty. Owens Shark, [place] 04.15.2022 9:12 AM  Gardiner Sleeper, M.D., Ph.D. Diseases & Surgery of the Retina and Vitreous [facility]  I have reviewed the above documentation for accuracy and completeness, and I agree with the above. Gardiner Sleeper, M.D., Ph.D. 07/12/20 9:12 AM   Abbreviations: M myopia (nearsighted); A astigmatism; H hyperopia (farsighted); P presbyopia; Mrx spectacle prescription;  CTL contact lenses; OD right eye; OS left eye; OU both eyes  XT exotropia; ET esotropia; PEK punctate epithelial keratitis; PEE punctate epithelial erosions; DES dry eye syndrome; MGD meibomian gland dysfunction; ATs artificial tears; PFAT's preservative free artificial tears; [place] nuclear sclerotic cataract; PSC posterior subcapsular cataract; ERM epi-retinal membrane; PVD posterior vitreous detachment; RD retinal detachment; DM diabetes mellitus; DR diabetic retinopathy; NPDR non-proliferative diabetic retinopathy; PDR proliferative diabetic retinopathy; CSME clinically significant macular edema; DME diabetic macular edema; dbh dot blot hemorrhages; CWS cotton wool spot; POAG primary open angle glaucoma; C/D cup-to-disc ratio; HVF humphrey visual field; GVF goldmann visual field; OCT optical coherence tomography; IOP intraocular pressure; BRVO Branch retinal vein occlusion; CRVO central retinal vein occlusion; CRAO central retinal artery occlusion; BRAO branch retinal artery occlusion; RT retinal tear; SB scleral buckle; PPV pars plana vitrectomy; VH Vitreous hemorrhage; PRP panretinal laser photocoagulation; IVK intravitreal kenalog; VMT  vitreomacular traction; MH Macular hole;  NVD neovascularization of the disc; NVE neovascularization elsewhere; AREDS age related eye disease study; ARMD age related macular degeneration; POAG primary open angle glaucoma; EBMD epithelial/anterior basement membrane dystrophy; ACIOL anterior chamber intraocular lens; IOL intraocular lens; PCIOL posterior chamber intraocular lens; Phaco/IOL phacoemulsification with intraocular lens placement; [place] photorefractive keratectomy; LASIK laser assisted in situ keratomileusis; HTN hypertension; DM diabetes mellitus; COPD chronic obstructive pulmonary disease

## 2020-07-12 ENCOUNTER — Encounter (INDEPENDENT_AMBULATORY_CARE_PROVIDER_SITE_OTHER): Payer: Self-pay | Admitting: Ophthalmology

## 2020-07-25 DIAGNOSIS — U071 COVID-19: Secondary | ICD-10-CM

## 2020-07-25 HISTORY — DX: COVID-19: U07.1

## 2020-08-04 NOTE — Progress Notes (Signed)
[facility] Note  08/06/2020     CHIEF COMPLAINT Patient presents for Retina Follow Up   HISTORY OF PRESENT ILLNESS: Jacqueline Cox is a 70 y.o. female who presents to the clinic today for:   HPI    Retina Follow Up    Patient presents with  PVD.  In both eyes.  Duration of 4 weeks.  Since onset it is stable.  I, the attending physician,  performed the HPI with the patient and updated documentation appropriately.          Comments    4 week follow up PVD OU- Denies any new problems, no increase in floaters, FOLs, or decreased vision.  Patient was Dx COVID last Friday.  No symptoms now just tired.         Last edited by Bernarda Caffey, MD on 08/06/2020  5:16 PM. (History)    pt states she is still seeing the same floater she was last month, she is not seeing any new floaters or fol   Referring physician: Harlen Labs, MD [phone] [facility] [address],  [place] [address]  HISTORICAL INFORMATION:   Selected notes from the MEDICAL RECORD NUMBER Referred by Dr. Truman Hayward:  Ocular Hx- PMH-    CURRENT MEDICATIONS: No current outpatient medications on file. (Ophthalmic Drugs)   No current facility-administered medications for this visit. (Ophthalmic Drugs)   Current Outpatient Medications (Other)  Medication Sig  . albuterol (PROVENTIL HFA;VENTOLIN HFA) 108 (90 Base) MCG/ACT inhaler as needed.  Marland Kitchen amLODipine (NORVASC) 5 MG tablet Take 5 mg by mouth daily.  Marland Kitchen atorvastatin (LIPITOR) 40 MG tablet Take 40 mg by mouth daily.  . busPIRone (BUSPAR) 15 MG tablet   . cetirizine (ZYRTEC) 10 MG tablet Take 10 mg by mouth daily.  . cholecalciferol (VITAMIN D) 1000 UNITS tablet Take 2,000 Units by mouth daily.   Marland Kitchen lisinopril-hydrochlorothiazide (ZESTORETIC) 20-12.5 MG tablet Take 1 tablet by mouth daily.  . methocarbamol (ROBAXIN) 500 MG tablet Take 1 tablet by mouth as needed.  . Multiple Vitamin (MULTIVITAMIN) capsule Take 1 capsule by mouth daily.  Marland Kitchen omeprazole  (PRILOSEC) 20 MG capsule Take 20 mg by mouth daily.  . pregabalin (LYRICA) 75 MG capsule Take 1 capsule by mouth daily.  . SitaGLIPtin-MetFORMIN HCl (JANUMET XR) [phone] MG TB24 Take by mouth.   No current facility-administered medications for this visit. (Other)      REVIEW OF SYSTEMS: ROS    Positive for: Endocrine, Eyes, Allergic/Imm   Negative for: Constitutional, Gastrointestinal, Neurological, Skin, Genitourinary, Musculoskeletal, HENT, Cardiovascular, Respiratory, Psychiatric, Heme/Lymph   Last edited by Leonie Douglas, COA on 08/06/2020  1:30 PM. (History)       ALLERGIES Allergies  Allergen Reactions  . Codeine Other (See Comments)    unknown  . Paxil [Paroxetine Hcl] Other (See Comments)    unknown  . Sulfamethoxazole-Trimethoprim Rash    PAST MEDICAL HISTORY Past Medical History:  Diagnosis Date  . Anxiety   . Atypical mole 05/20/2003   Right Foot, Dorsum (slight)  . Atypical mole 07/20/2003   Right Upper Arm (Minimal)  . Atypical mole 07/20/2003   Right Shin (Mild)  . Atypical mole 09/24/2012   Right Ant. Thigh (Severe) (widershave)  . Atypical mole 10/10/2012   Right Thigh (Mild)  . BCC (basal cell carcinoma of skin) 09/24/2002   Upper Left Lip (MOH's)  . Condyloma acuminata   . Diabetes mellitus without complication ([place]) 09/8467   type 2  .  Hypertension late 42s  . Psoriasis age 74   hair line, pubic, hands  . Superficial basal cell carcinoma (BCC) 06/20/2011   Right Side Nose ([place])  . Superficial basal cell carcinoma (BCC) 06/02/2014   Right Side Nose   Past Surgical History:  Procedure Laterality Date  . ABDOMINAL HYSTERECTOMY  1990   TAH, AUB, ? endometriosis, cervical dysplasia  . APPENDECTOMY     done at the time of hysterectomy  . BREAST SURGERY Left 1993 & 1995   benign BX  . CERVICAL DISCECTOMY  1994   C 4-5  . INCISION / [place] BONE ELBOW Right 2001  . OPEN REPAIR PERIARTICULAR FRACTURE / DISLOCATION ELBOW Right 2000 for  3 surgeries   secondary to a fall  . SHOULDER DEBRIDEMENT Left 07/2009   bone spur  . TUBAL LIGATION  1986    FAMILY HISTORY Family History  Problem Relation Age of Onset  . Hypertension Mother   . Brain cancer Mother 64  . Hypertension Father   . Diabetes Father   . Diabetes Brother   . Hypertension Brother   . Lung cancer Brother 18       Lung cancer  . Hypertension Sister   . Hyperlipidemia Sister   . Diabetes Sister   . Hypertension Sister   . Parkinson's disease Sister     SOCIAL HISTORY Social History   Tobacco Use  . Smoking status: Never Smoker  . Smokeless tobacco: Never Used  Vaping Use  . Vaping Use: Never used  Substance Use Topics  . Alcohol use: No  . Drug use: No         OPHTHALMIC EXAM:  Base Eye Exam    Visual Acuity (Snellen - Linear)      Right Left   Dist [place] 20/25 -2 20/20       Tonometry (Tonopen, 1:35 PM)      Right Left   Pressure 19 18       Pupils      Dark Light Shape React APD   Right 2 1 Round Brisk None   Left 2 1 Round Brisk None       Visual Fields (Counting fingers)      Left Right    Full Full       Extraocular Movement      Right Left    Full Full       Neuro/Psych    Oriented x3: Yes   Mood/Affect: Normal       Dilation    Both eyes: 1.0% Mydriacyl, 2.5% Phenylephrine @ 1:35 PM        Slit Lamp and Fundus Exam    Slit Lamp Exam      Right Left   Lids/Lashes Dermatochalasis - upper lid, Meibomian gland dysfunction Dermatochalasis - upper lid, Meibomian gland dysfunction   Conjunctiva/Sclera White and quiet White and quiet   Cornea Mild arcus, 1-2+ Punctate epithelial erosions, EBMD, well healed temporal cataract wounds Mild arcus, 1-2+ Punctate epithelial erosions, trace EBMD, well healed temporal cataract wounds   Anterior Chamber Deep and quiet Deep and quiet   Iris Round and dilated, No NVI Round and dilated, No NVI   Lens PC IOL in good position Toric PC IOL in good position with marks at 0100  and 0700   Vitreous Vitreous syneresis, Posterior vitreous detachment, prominent vitreous condensations settling inferiorly Vitreous syneresis, Posterior vitreous detachment, vitreous condensations       Fundus Exam  Right Left   Disc mild Pallor, mild tilt, temporal PPA/PPP, Sharp rim Trace Pallor, mild tilt, Sharp rim, temporal PPA   C/D Ratio 0.2 0.2   Macula Flat, Blunted foveal reflex, RPE mottling and clumping, RPE atrophy nasal mac, No heme or edema Flat, Blunted foveal reflex, RPE mottling and clumping, No heme or edema   Vessels attenuated, mild tortuousity attenuated, mild tortuousity   Periphery Attached, No RT/RD Attached, pigmented CR scar at 0200 with overlying punctate operculum -- no SRF          IMAGING AND PROCEDURES  Imaging and Procedures for 08/06/2020  OCT, Retina - OU - Both Eyes       Right Eye Quality was good. Central Foveal Thickness: 293. Progression has been stable. Findings include normal foveal contour, no IRF, no SRF, myopic contour.   Left Eye Quality was good. Central Foveal Thickness: 306. Progression has been stable. Findings include normal foveal contour, no IRF, no SRF, vitreomacular adhesion .   Notes *Images captured and stored on drive  Diagnosis / Impression:  NFP; no IRF/SRF OU  Clinical management:  See below  Abbreviations: NFP - Normal foveal profile. CME - cystoid macular edema. PED - pigment epithelial detachment. IRF - intraretinal fluid. SRF - subretinal fluid. EZ - ellipsoid zone. ERM - epiretinal membrane. ORA - outer retinal atrophy. ORT - outer retinal tubulation. SRHM - subretinal hyper-reflective material. IRHM - intraretinal hyper-reflective material               ASSESSMENT/PLAN:    ICD-10-CM   1. Posterior vitreous detachment of both eyes  H43.813   2. Retinal edema  H35.81 OCT, Retina - OU - Both Eyes  3. Diabetes mellitus type 2 without retinopathy ([place])  E11.9   4. Essential hypertension  I10   5.  Hypertensive retinopathy of both eyes  H35.033   6. Pseudophakia, both eyes  Z96.1     1,2. PVD / vitreous syneresis OU  - OD with acute symptomatic floaters, onset ~4.13.22  - OS is asymptomatic   - Discussed findings and prognosis  - No RT or RD on 360 scleral depressed exam  - Reviewed s/s of RT/RD  - Strict return precautions for any such RT/RD signs/symptoms  - pt is cleared from a retina standpoint for release to Dr. Milagros Reap and resumption of primary eye care  - pt can f/u here prn  3. Diabetes mellitus, type 2 without retinopathy - The incidence, risk factors for progression, natural history and treatment options for diabetic retinopathy  were discussed with patient.   - The need for close monitoring of blood glucose, blood pressure, and serum lipids, avoiding cigarette or any type of tobacco, and the need for long term follow up was also discussed with patient. - f/u in 1 year, sooner prn  4,5. Hypertensive retinopathy OU - discussed importance of tight BP control - monitor  6. Pseudophakia OU  - s/p CE/IOL (Dr. Zenia Resides)  - IOL in good position, doing well - monitor  Ophthalmic Meds Ordered this visit:  No orders of the defined types were placed in this encounter.      Return if symptoms worsen or fail to improve.  There are no Patient Instructions on file for this visit.   Explained the diagnoses, plan, and follow up with the patient and they expressed understanding.  Patient expressed understanding of the importance of proper follow up care.   This document serves as a record of services personally  performed by Gardiner Sleeper, MD, PhD. It was created on their behalf by San Jetty. Owens Shark, OA an ophthalmic technician. The creation of this record is the provider's dictation and/or activities during the visit.    Electronically signed by: San Jetty. Owens Shark, [place] 05.11.2022 5:19 PM  Gardiner Sleeper, M.D., Ph.D. Diseases & Surgery of the Retina and Vitreous [facility]  I have reviewed the above documentation for accuracy and completeness, and I agree with the above. Gardiner Sleeper, M.D., Ph.D. 08/06/20 5:19 PM   Abbreviations: M myopia (nearsighted); A astigmatism; H hyperopia (farsighted); P presbyopia; Mrx spectacle prescription;  CTL contact lenses; OD right eye; OS left eye; OU both eyes  XT exotropia; ET esotropia; PEK punctate epithelial keratitis; PEE punctate epithelial erosions; DES dry eye syndrome; MGD meibomian gland dysfunction; ATs artificial tears; PFAT's preservative free artificial tears; [place] nuclear sclerotic cataract; PSC posterior subcapsular cataract; ERM epi-retinal membrane; PVD posterior vitreous detachment; RD retinal detachment; DM diabetes mellitus; DR diabetic retinopathy; NPDR non-proliferative diabetic retinopathy; PDR proliferative diabetic retinopathy; CSME clinically significant macular edema; DME diabetic macular edema; dbh dot blot hemorrhages; CWS cotton wool spot; POAG primary open angle glaucoma; C/D cup-to-disc ratio; HVF humphrey visual field; GVF goldmann visual field; OCT optical coherence tomography; IOP intraocular pressure; BRVO Branch retinal vein occlusion; CRVO central retinal vein occlusion; CRAO central retinal artery occlusion; BRAO branch retinal artery occlusion; RT retinal tear; SB scleral buckle; PPV pars plana vitrectomy; VH Vitreous hemorrhage; PRP panretinal laser photocoagulation; IVK intravitreal kenalog; VMT vitreomacular traction; MH Macular hole;  NVD neovascularization of the disc; NVE neovascularization elsewhere; AREDS age related eye disease study; ARMD age related macular degeneration; POAG primary open angle glaucoma; EBMD epithelial/anterior basement membrane dystrophy; ACIOL anterior chamber intraocular lens; IOL intraocular lens; PCIOL posterior chamber intraocular lens; Phaco/IOL phacoemulsification with intraocular lens placement; [place] photorefractive keratectomy; LASIK laser  assisted in situ keratomileusis; HTN hypertension; DM diabetes mellitus; COPD chronic obstructive pulmonary disease

## 2020-08-06 ENCOUNTER — Ambulatory Visit (INDEPENDENT_AMBULATORY_CARE_PROVIDER_SITE_OTHER): Payer: Medicare HMO | Admitting: Ophthalmology

## 2020-08-06 ENCOUNTER — Other Ambulatory Visit: Payer: Self-pay

## 2020-08-06 ENCOUNTER — Encounter (INDEPENDENT_AMBULATORY_CARE_PROVIDER_SITE_OTHER): Payer: Self-pay | Admitting: Ophthalmology

## 2020-08-06 DIAGNOSIS — H43813 Vitreous degeneration, bilateral: Secondary | ICD-10-CM

## 2020-08-06 DIAGNOSIS — E119 Type 2 diabetes mellitus without complications: Secondary | ICD-10-CM

## 2020-08-06 DIAGNOSIS — H3581 Retinal edema: Secondary | ICD-10-CM

## 2020-08-06 DIAGNOSIS — Z961 Presence of intraocular lens: Secondary | ICD-10-CM

## 2020-08-06 DIAGNOSIS — I10 Essential (primary) hypertension: Secondary | ICD-10-CM | POA: Diagnosis not present

## 2020-08-06 DIAGNOSIS — H35033 Hypertensive retinopathy, bilateral: Secondary | ICD-10-CM

## 2020-09-08 ENCOUNTER — Ambulatory Visit: Payer: Medicare HMO | Admitting: Physician Assistant

## 2020-09-28 NOTE — Progress Notes (Signed)
70 y.o. G40P2002 Married Caucasian female here for annual exam.    Patient and husband had Covid in May.  Some fatigue.  Has received one Covid booster.   Having hip pain.  She is going to see a podiatrist first.  She is having a balance issue.   Exercising more and loosing weight.  A1C is 6.3.  Retired one year ago.   PCP:   Chesley Noon, MD  Patient's last menstrual period was 07/25/1988.           Sexually active: Yes.    The current method of family planning is tubal ligation, hysterectomy.   Exercising: Yes.    Gym. Smoker:  no  Health Maintenance: Pap:  05-29-06 normal History of abnormal Pap:  yes Prior to Hyst. MMG: 08-20-20 normal [facility] - BI-RADS2.  Colonoscopy: 03-2020 normal BMD:   10/20/19 -   Result  Osteopenia of hip and spine.  Low fracture risk by FRAX.  TDaP:  2012 Gardasil:   no HIV:Neg Hep C:Neg Screening Labs:  Hb today: PCP, Urine today: PCP   reports that she has never smoked. She has never used smokeless tobacco. She reports current alcohol use. She reports that she does not use drugs.  Past Medical History:  Diagnosis Date   Anxiety    Atypical mole 05/20/2003   Right Foot, Dorsum (slight)   Atypical mole 07/20/2003   Right Upper Arm (Minimal)   Atypical mole 07/20/2003   Right Shin (Mild)   Atypical mole 09/24/2012   Right Ant. Thigh (Severe) (widershave)   Atypical mole 10/10/2012   Right Thigh (Mild)   BCC (basal cell carcinoma of skin) 09/24/2002   Upper Left Lip (MOH's)   Condyloma acuminata    COVID 07/2020   Diabetes mellitus without complication ([place]) 69/6295   type 2   Hypertension late 24s   Psoriasis age 78   hair line, pubic, hands   Superficial basal cell carcinoma (BCC) 06/20/2011   Right Side Nose ([place])   Superficial basal cell carcinoma (BCC) 06/02/2014   Right Side Nose    Past Surgical History:  Procedure Laterality Date   ABDOMINAL HYSTERECTOMY  1990   TAH, AUB, ? endometriosis, cervical dysplasia    APPENDECTOMY     done at the time of hysterectomy   BREAST SURGERY Left 1993 & 1995   benign BX   CERVICAL DISCECTOMY  1994   C 4-5   INCISION / [place]BRID[place] BONE ELBOW Right 2001   OPEN REPAIR PERIARTICULAR FRACTURE / DISLOCATION ELBOW Right 2000 for 3 surgeries   secondary to a fall   SHOULDER DEBRIDEMENT Left 07/2009   bone spur   TUBAL LIGATION  1986    Current Outpatient Medications  Medication Sig Dispense Refill   albuterol (PROVENTIL HFA;VENTOLIN HFA) 108 (90 Base) MCG/ACT inhaler as needed.     amLODipine (NORVASC) 5 MG tablet Take 5 mg by mouth daily.     atorvastatin (LIPITOR) 40 MG tablet Take 40 mg by mouth daily.     busPIRone (BUSPAR) 15 MG tablet      cetirizine (ZYRTEC) 10 MG tablet Take 10 mg by mouth daily.     cholecalciferol (VITAMIN D) 1000 UNITS tablet Take 2,000 Units by mouth daily.      lisinopril-hydrochlorothiazide (ZESTORETIC) 20-12.5 MG tablet Take 1 tablet by mouth daily.     methocarbamol (ROBAXIN) 500 MG tablet Take 1 tablet by mouth as needed.     Multiple Vitamin (MULTIVITAMIN) capsule Take 1 capsule by  mouth daily.     omeprazole (PRILOSEC) 20 MG capsule Take 20 mg by mouth daily.     pregabalin (LYRICA) 75 MG capsule Take 1 capsule by mouth daily.     SitaGLIPtin-MetFORMIN HCl (JANUMET XR) [phone] MG TB24 Take by mouth.     No current facility-administered medications for this visit.    Family History  Problem Relation Age of Onset   Hypertension Mother    Brain cancer Mother 21   Hypertension Father    Diabetes Father    Diabetes Brother    Hypertension Brother    Lung cancer Brother 83       Lung cancer   Hypertension Sister    Hyperlipidemia Sister    Diabetes Sister    Hypertension Sister    Parkinson's disease Sister     Review of Systems  All other systems reviewed and are negative.  Exam:   BP 122/70 (BP Location: Right Arm, Patient Position: Sitting, Cuff Size: Normal)   Pulse 74   Ht 5' 4.5" (1.638 m)   Wt 146 lb  (66.2 kg)   LMP 07/25/1988   SpO2 98%   BMI 24.67 kg/m     General appearance: alert, cooperative and appears stated age Head: normocephalic, without obvious abnormality, atraumatic Lungs: clear to auscultation bilaterally Breasts: right - normal appearance, no masses or tenderness, No nipple retraction or dimpling, No nipple discharge or bleeding, No axillary adenopathy Left - scar of left upper breast, no masses or tenderness, No nipple retraction or dimpling, No nipple discharge or bleeding, No axillary adenopathy Heart: regular rate and rhythm Abdomen: soft, non-tender; no masses, no organomegaly Extremities: extremities normal, atraumatic, no cyanosis or edema Skin: skin color, texture, turgor normal. No rashes or lesions Lymph nodes: cervical, supraclavicular, and axillary nodes normal.  Pelvic: External genitalia:  no lesions              No abnormal inguinal nodes palpated.              Urethra:  normal appearing urethra with no masses, tenderness or lesions              Bartholins and Skenes: normal                 Vagina: vaginal atrophy noted.              Cervix: absent              Pap taken: no Bimanual Exam:  Uterus:  absent              Adnexa: no mass, fullness, tenderness              Rectal exam: yes.  Confirms.              Anus:  normal sphincter tone, no lesions  Chaperone was present for exam.  Assessment:   Status post TAH.  Ovaries remain. Pelvic exam with abnormal finding present.  Menopausal atrophy.  Remote hx abnormal paps. Vaginal lesion in 2019.  Biopsies benign. Screening breast exam. Status post left breast biopsy x 2.  Benign. Rectal exam. Osteopenia.  PCP following.  DM.   Plan: Mammogram screening discussed. Self breast awareness reviewed. Pap not indicated.  Guidelines for Calcium, Vitamin D, regular exercise program including cardiovascular and weight bearing exercise. TDap vaccine.  BMD in 2023.  Follow up 2 years and prn.    After visit summary provided.

## 2020-10-04 ENCOUNTER — Encounter: Payer: Self-pay | Admitting: Obstetrics and Gynecology

## 2020-10-04 ENCOUNTER — Ambulatory Visit (INDEPENDENT_AMBULATORY_CARE_PROVIDER_SITE_OTHER): Payer: Medicare HMO | Admitting: Obstetrics and Gynecology

## 2020-10-04 ENCOUNTER — Other Ambulatory Visit: Payer: Self-pay

## 2020-10-04 VITALS — BP 122/70 | HR 74 | Ht 64.5 in | Wt 146.0 lb

## 2020-10-04 DIAGNOSIS — Z008 Encounter for other general examination: Secondary | ICD-10-CM

## 2020-10-04 DIAGNOSIS — N952 Postmenopausal atrophic vaginitis: Secondary | ICD-10-CM

## 2020-10-04 DIAGNOSIS — Z01411 Encounter for gynecological examination (general) (routine) with abnormal findings: Secondary | ICD-10-CM

## 2020-10-04 DIAGNOSIS — Z23 Encounter for immunization: Secondary | ICD-10-CM | POA: Diagnosis not present

## 2020-10-04 DIAGNOSIS — Z1239 Encounter for other screening for malignant neoplasm of breast: Secondary | ICD-10-CM

## 2020-10-04 NOTE — Patient Instructions (Signed)

## 2020-10-27 ENCOUNTER — Encounter: Payer: Self-pay | Admitting: Obstetrics and Gynecology

## 2020-10-28 ENCOUNTER — Ambulatory Visit: Payer: Medicare HMO | Admitting: Obstetrics and Gynecology

## 2020-10-28 ENCOUNTER — Encounter: Payer: Self-pay | Admitting: Obstetrics and Gynecology

## 2020-10-28 ENCOUNTER — Other Ambulatory Visit: Payer: Self-pay

## 2020-10-28 VITALS — BP 130/68 | HR 70 | Ht 64.5 in | Wt 146.0 lb

## 2020-10-28 DIAGNOSIS — N907 Vulvar cyst: Secondary | ICD-10-CM

## 2020-10-28 DIAGNOSIS — Z87440 Personal history of urinary (tract) infections: Secondary | ICD-10-CM | POA: Diagnosis not present

## 2020-10-28 DIAGNOSIS — N763 Subacute and chronic vulvitis: Secondary | ICD-10-CM | POA: Diagnosis not present

## 2020-10-28 MED ORDER — TRIAMCINOLONE ACETONIDE 0.025 % EX OINT: 1.0000 "application " | TOPICAL_OINTMENT | Freq: Two times a day (BID) | CUTANEOUS | 0 refills | Status: DC

## 2020-10-28 NOTE — Progress Notes (Signed)
GYNECOLOGY  VISIT   HPI: 70 y.o.   Married  Caucasian  female   G2P2002 with Patient's last menstrual period was 07/25/1988.   here for vaginal lesions, onset 3 days ago. She had an additional area appear 2 days ago.  Area is lessening in size.  A little tender.  Even walking and standing creates a pressure feeling.  Has had this in the past and had drainage.   She is concerned because she had DM.  Patient had a vaginal lesion in 2019 and had a benign biopsy with colposcopy.  Remote history of hysterectomy for endometriosis and cervical dysplasia.  Just finished Omnicef for a UTI on Monday through her PCP.  She would like urine checked today. Dysuria has resolved.   She does have some chronic intermittent discomfort of her vulva.   GYNECOLOGIC HISTORY: Patient's last menstrual period was 07/25/1988. Contraception: Tubal/Hyst Menopausal hormone therapy: None Last mammogram: 08-20-20 normal [facility] - BI-RADS2. Last pap smear:  2008 normal        OB History     Gravida  2   Para  2   Term  2   Preterm  0   AB  0   Living  2      SAB  0   IAB  0   Ectopic  0   Multiple  0   Live Births  2              Patient Active Problem List   Diagnosis Date Noted   Seasonal allergies 11/26/2018   Gastroesophageal reflux disease 07/29/2018   Diabetes mellitus, type 2 ([place]) 06/11/2012   HLD (hyperlipidemia) 06/11/2012   BP (high blood pressure) 06/11/2012   Hyperlipidemia associated with type 2 diabetes mellitus ([place]) 06/11/2012    Past Medical History:  Diagnosis Date   Anxiety    Atypical mole 05/20/2003   Right Foot, Dorsum (slight)   Atypical mole 07/20/2003   Right Upper Arm (Minimal)   Atypical mole 07/20/2003   Right Shin (Mild)   Atypical mole 09/24/2012   Right Ant. Thigh (Severe) (widershave)   Atypical mole 10/10/2012   Right Thigh (Mild)   BCC (basal cell carcinoma of skin) 09/24/2002   Upper Left Lip (MOH's)   Condyloma acuminata    COVID  07/2020   Diabetes mellitus without complication ([place]) 99991111   type 2   Hypertension late 15s   Psoriasis age 89   hair line, pubic, hands   Superficial basal cell carcinoma (BCC) 06/20/2011   Right Side Nose ([place])   Superficial basal cell carcinoma (BCC) 06/02/2014   Right Side Nose    Past Surgical History:  Procedure Laterality Date   ABDOMINAL HYSTERECTOMY  1990   TAH, AUB, ? endometriosis, cervical dysplasia   APPENDECTOMY     done at the time of hysterectomy   BREAST SURGERY Left 1993 & 1995   benign BX   CERVICAL DISCECTOMY  1994   C 4-5   INCISION / DEBRIDEMENT BONE ELBOW Right 2001   OPEN REPAIR PERIARTICULAR FRACTURE / DISLOCATION ELBOW Right 2000 for 3 surgeries   secondary to a fall   SHOULDER DEBRIDEMENT Left 07/2009   bone spur   TUBAL LIGATION  1986    Current Outpatient Medications  Medication Sig Dispense Refill   albuterol (PROVENTIL HFA;VENTOLIN HFA) 108 (90 Base) MCG/ACT inhaler as needed.     amLODipine (NORVASC) 5 MG tablet Take 5 mg by mouth daily.     atorvastatin (LIPITOR)  40 MG tablet Take 40 mg by mouth daily.     busPIRone (BUSPAR) 15 MG tablet      cetirizine (ZYRTEC) 10 MG tablet Take 10 mg by mouth daily.     cholecalciferol (VITAMIN D) 1000 UNITS tablet Take 2,000 Units by mouth daily.      lisinopril-hydrochlorothiazide (ZESTORETIC) 20-12.5 MG tablet Take 1 tablet by mouth daily.     methocarbamol (ROBAXIN) 500 MG tablet Take 1 tablet by mouth as needed.     Multiple Vitamin (MULTIVITAMIN) capsule Take 1 capsule by mouth daily.     omeprazole (PRILOSEC) 20 MG capsule Take 20 mg by mouth daily.     pregabalin (LYRICA) 75 MG capsule Take 1 capsule by mouth daily.     SitaGLIPtin-MetFORMIN HCl (JANUMET XR) [phone] MG TB24 Take by mouth.     cefdinir (OMNICEF) 300 MG capsule Take 300 mg by mouth 2 (two) times daily. (Patient not taking: Reported on 10/28/2020)     No current facility-administered medications for this visit.      ALLERGIES: Codeine, Paxil [paroxetine hcl], and Sulfamethoxazole-trimethoprim  Family History  Problem Relation Age of Onset   Hypertension Mother    Brain cancer Mother 90   Hypertension Father    Diabetes Father    Diabetes Brother    Hypertension Brother    Lung cancer Brother 62       Lung cancer   Hypertension Sister    Hyperlipidemia Sister    Diabetes Sister    Hypertension Sister    Parkinson's disease Sister     Social History   Socioeconomic History   Marital status: Married    Spouse name: Not on file   Number of children: Not on file   Years of education: Not on file   Highest education level: Not on file  Occupational History   Not on file  Tobacco Use   Smoking status: Never   Smokeless tobacco: Never  Vaping Use   Vaping Use: Never used  Substance and Sexual Activity   Alcohol use: Yes    Comment: Rare   Drug use: No   Sexual activity: Not Currently    Partners: Male    Birth control/protection: Post-menopausal  Other Topics Concern   Not on file  Social History Narrative   Not on file   Social Determinants of Health   Financial Resource Strain: Not on file  Food Insecurity: Not on file  Transportation Needs: Not on file  Physical Activity: Not on file  Stress: Not on file  Social Connections: Not on file  Intimate Partner Violence: Not on file    Review of Systems  All other systems reviewed and are negative.  PHYSICAL EXAMINATION:    BP 130/68   Pulse 70   Ht 5' 4.5" (1.638 m)   Wt 146 lb (66.2 kg)   LMP 07/25/1988   SpO2 99%   BMI 24.67 kg/m     General appearance: alert, cooperative and appears stated age  Pelvic: External genitalia:  left labia minora cyst 6 mm, nontender.               Urethra:  normal appearing urethra with no masses, tenderness or lesions            Chaperone was present for exam:  Estill Bamberg  ASSESSMENT  Vulvar cyst. No evidence of abscess.  Hx vulvitis.  Recent UTI.   DM.   PLAN  We  discussed sebaceous cysts.  Triamcinolone ointment.  Urinalysis:  sg 1.015, pH 7.0, 0 - 5 WBC, Ns RBS, 0 - 5 squams, few bacteria.  Reflex UC.  Fu prn.   An After Visit Summary was printed and given to the patient.

## 2020-10-28 NOTE — Patient Instructions (Signed)
Epidermoid Cyst  An epidermoid cyst, also called an epidermal cyst, is a small lump under your skin. The cyst contains a substance called keratin. Do not try to pop or openthe cyst yourself. What are the causes? A blocked hair follicle. A hair that curls and re-enters the skin instead of growing straight out of the skin. A blocked pore. Irritated skin. An injury to the skin. Certain conditions that are passed along from parent to child. Human papillomavirus (HPV). This happens rarely when cysts occur on the bottom of the feet. Long-term sun damage to the skin. What increases the risk? Having acne. Being female. Having an injury to the skin. Being past puberty. Having certain conditions caused by genes (genetic disorder) What are the signs or symptoms? These cysts are usually harmless, but they can get infected. Symptoms of infection may include: Redness. Inflammation. Tenderness. Warmth. Fever. A bad-smelling substance that drains from the cyst. Pus that drains from the cyst. How is this treated? In many cases, epidermoid cysts go away on their own without treatment. If a cyst becomes infected, treatment may include: Opening and draining the cyst, done by a doctor. After draining, you may need minor surgery to remove the rest of the cyst. Antibiotic medicine. Shots of medicines (steroids) that help to reduce inflammation. Surgery to remove the cyst. Surgery may be done if the cyst: Becomes large. Bothers you. Has a chance of turning into cancer. Do not try to open a cyst yourself. Follow these instructions at home: Medicines Take over-the-counter and prescription medicines as told by your doctor. If you were prescribed an antibiotic medicine, take it as told by your doctor. Do not stop taking it even if you start to feel better. General instructions Keep the area around your cyst clean and dry. Wear loose, dry clothing. Avoid touching your cyst. Check your cyst every day  for signs of infection. Check for: Redness, swelling, or pain. Fluid or blood. Warmth. Pus or a bad smell. Keep all follow-up visits. How is this prevented? Wear clean, dry, clothing. Avoid wearing tight clothing. Keep your skin clean and dry. Take showers or baths every day. Contact a doctor if: Your cyst has symptoms of infection. Your condition does not improve or gets worse. You have a cyst that looks different from other cysts you have had. You have a fever. Get help right away if: Redness spreads from the cyst into the area close by. Summary An epidermoid cyst is a small lump under your skin. If a cyst becomes infected, treatment may include surgery to open and drain the cyst, or to remove it. Take over-the-counter and prescription medicines only as told by your doctor. Contact a doctor if your condition is not improving or is getting worse. Keep all follow-up visits. This information is not intended to replace advice given to you by your health care provider. Make sure you discuss any questions you have with your healthcare provider. Document Revised: 06/18/2019 Document Reviewed: 06/18/2019 Elsevier Patient Education  [place].

## 2020-10-30 LAB — CULTURE INDICATED

## 2020-10-30 LAB — URINE CULTURE
MICRO NUMBER:: 12201689
SPECIMEN QUALITY:: ADEQUATE

## 2020-10-30 LAB — URINALYSIS, COMPLETE W/RFL CULTURE
Bilirubin Urine: NEGATIVE
Glucose, UA: NEGATIVE
Hgb urine dipstick: NEGATIVE
Hyaline Cast: NONE SEEN /LPF
Ketones, ur: NEGATIVE
Nitrites, Initial: NEGATIVE
Protein, ur: NEGATIVE
RBC / HPF: NONE SEEN /HPF (ref 0–2)
Specific Gravity, Urine: 1.015 (ref 1.001–1.035)
pH: 7 (ref 5.0–8.0)

## 2020-12-15 ENCOUNTER — Encounter: Payer: Self-pay | Admitting: Physician Assistant

## 2020-12-15 ENCOUNTER — Other Ambulatory Visit: Payer: Self-pay

## 2020-12-15 ENCOUNTER — Ambulatory Visit: Payer: Medicare HMO | Admitting: Physician Assistant

## 2020-12-15 DIAGNOSIS — L821 Other seborrheic keratosis: Secondary | ICD-10-CM

## 2020-12-15 DIAGNOSIS — L814 Other melanin hyperpigmentation: Secondary | ICD-10-CM

## 2020-12-15 DIAGNOSIS — Z85828 Personal history of other malignant neoplasm of skin: Secondary | ICD-10-CM

## 2020-12-15 DIAGNOSIS — Z86018 Personal history of other benign neoplasm: Secondary | ICD-10-CM | POA: Diagnosis not present

## 2020-12-15 DIAGNOSIS — L570 Actinic keratosis: Secondary | ICD-10-CM | POA: Diagnosis not present

## 2020-12-15 DIAGNOSIS — Z1283 Encounter for screening for malignant neoplasm of skin: Secondary | ICD-10-CM | POA: Diagnosis not present

## 2020-12-15 DIAGNOSIS — D229 Melanocytic nevi, unspecified: Secondary | ICD-10-CM

## 2020-12-15 DIAGNOSIS — L578 Other skin changes due to chronic exposure to nonionizing radiation: Secondary | ICD-10-CM

## 2020-12-15 DIAGNOSIS — D1800 Hemangioma unspecified site: Secondary | ICD-10-CM

## 2020-12-15 NOTE — Progress Notes (Signed)
   Follow-Up Visit   Subjective  Jacqueline Cox is a 70 y.o. female who presents for the following: Annual Exam (Check back she feels like its a new spot. Personal history of bcc and atypical moles ).   The following portions of the chart were reviewed this encounter and updated as appropriate:  Tobacco  Allergies  Meds  Problems  Med Hx  Surg Hx  Fam Hx      Objective  Well appearing patient in no apparent distress; mood and affect are within normal limits.  A full examination was performed including scalp, head, eyes, ears, nose, lips, neck, chest, axillae, abdomen, back, buttocks, bilateral upper extremities, bilateral lower extremities, hands, feet, fingers, toes, fingernails, and toenails. All findings within normal limits unless otherwise noted below.  Head to toe No atypical nevi No signs of non-mole skin cancer. Dyspigmented scars are clear.    Assessment & Plan  Screening exam for skin cancer Head to toe  yearly skin checks   AK (actinic keratosis) (2) Right Forearm - Posterior; Dorsum of Nose  Destruction of lesion - Dorsum of Nose, Right Forearm - Posterior Complexity: simple   Destruction method: cryotherapy   Informed consent: discussed and consent obtained   Timeout:  patient name, date of birth, surgical site, and procedure verified Lesion destroyed using liquid nitrogen: Yes   Cryotherapy cycles:  3 Outcome: patient tolerated procedure well with no complications   Post-procedure details: wound care instructions given    Lentigines - Scattered tan macules - Discussed due to sun exposure - Benign, observe - Call for any changes  Seborrheic Keratoses- back - Stuck-on, waxy, tan-brown papules and plaques  - Discussed benign etiology and prognosis. - Observe - Call for any changes  Melanocytic Nevi - Tan-brown and/or pink-flesh-colored symmetric macules and papules - Benign appearing on exam today - Observation - Call clinic for new or changing  moles - Recommend daily use of broad spectrum spf 30+ sunscreen to sun-exposed areas.   Hemangiomas - Red papules - Discussed benign nature - Observe - Call for any changes  Actinic Damage - diffuse scaly erythematous macules with underlying dyspigmentation - Recommend daily broad spectrum sunscreen SPF 30+ to sun-exposed areas, reapply every 2 hours as needed.  - Call for new or changing lesions.  Skin cancer screening performed today.   I, Amada Hallisey, PA-C, have reviewed all documentation's for this visit.  The documentation on 12/15/20 for the exam, diagnosis, procedures and orders are all accurate and complete.

## 2020-12-16 ENCOUNTER — Telehealth: Payer: Self-pay | Admitting: Physician Assistant

## 2020-12-16 NOTE — Telephone Encounter (Signed)
error 

## 2021-06-29 ENCOUNTER — Encounter: Payer: Self-pay | Admitting: Radiology

## 2021-06-29 ENCOUNTER — Ambulatory Visit: Payer: Medicare HMO | Admitting: Radiology

## 2021-06-29 ENCOUNTER — Telehealth: Payer: Self-pay | Admitting: *Deleted

## 2021-06-29 VITALS — BP 144/82

## 2021-06-29 DIAGNOSIS — N6321 Unspecified lump in the left breast, upper outer quadrant: Secondary | ICD-10-CM | POA: Diagnosis not present

## 2021-06-29 NOTE — Telephone Encounter (Signed)
Patient scheduled on 07/07/21 @ 11:4am at [facility] location. Order faxed to [phone]. Patient aware. [facility] scheduler said since patient mammogram is due in May. They will schedule bilateral diag mammogram and left breat ultrasound. ?

## 2021-06-29 NOTE — Telephone Encounter (Signed)
-----   Message from Kerry Dory, NP sent at 06/29/2021  8:07 AM EDT ----- ?Regarding: Diagnostic mammo ?Please schedule diagnostic mammogram, left breast 1cm mass present above previous biopsy site. Previous scans done at [facility] in [place], pt would like appointment there. Thanks. ? ?

## 2021-06-29 NOTE — Progress Notes (Signed)
? ?  Jacqueline Cox November 04, 1950 [id number] ? ? ?History:  71 y.o. G2P2 presents with complaints of breast mass left breast, husband noticed last week. Adjacent to a biopsy site from years ago. Last mammo 4/22 [facility], negative. ? ?Gynecologic History ?Patient's last menstrual period was 07/25/1988. ?  ? ?Obstetric History ?OB History  ?Gravida Para Term Preterm AB Living  ?'2 2 2 '$ 0 0 2  ?SAB IAB Ectopic Multiple Live Births  ?0 0 0 0 2  ?  ?# Outcome Date GA Lbr Len/2nd Weight Sex Delivery Anes PTL Lv  ?2 Term 74    F Vag-Spont   LIV  ?[place]  ? ? ? ?The following portions of the patient's history were reviewed and updated as appropriate: allergies, current medications, past family history, past medical history, past social history, past surgical history, and problem list. ? ?Review of Systems ?Pertinent items noted in HPI and remainder of comprehensive ROS otherwise negative.  ? ?Past medical history, past surgical history, family history and social history were all reviewed and documented in the EPIC chart. ? ? ?Exam: ? ?Vitals:  ? 06/29/21 0754  ?BP: (!) 144/82  ? ?There is no height or weight on file to calculate BMI. ? ?General appearance:  Normal ?Thyroid:  Symmetrical, normal in size, without palpable masses or nodularity. ?Respiratory ? Auscultation:  Clear without wheezing or rhonchi ?Cardiovascular ? Auscultation:  Regular rate, without rubs, murmurs or gallops ? Edema/varicosities:  Not grossly evident ?Breasts: right breast normal without mass, skin or nipple changes or axillary nodes, abnormal mass palpable left breast between 2 and 3 oclock above previous biopsy site, 1cm, firm, nonmobile, nontender.  ? ?Patient informed chaperone available to be present for breast exam. Patient has requested no chaperone to be present.  ? ?Assessment/Plan:   ?Breast mass, left ? Schedule diagnostic mammo ? ? ?Rubbie Battiest B WHNP-BC 8:06 AM 06/29/2021  ?

## 2021-08-25 ENCOUNTER — Ambulatory Visit: Payer: Medicare HMO | Attending: Orthopedic Surgery

## 2021-08-25 DIAGNOSIS — M25552 Pain in left hip: Secondary | ICD-10-CM | POA: Insufficient documentation

## 2021-08-25 DIAGNOSIS — M5459 Other low back pain: Secondary | ICD-10-CM | POA: Diagnosis present

## 2021-08-25 DIAGNOSIS — R262 Difficulty in walking, not elsewhere classified: Secondary | ICD-10-CM | POA: Insufficient documentation

## 2021-08-25 DIAGNOSIS — M1612 Unilateral primary osteoarthritis, left hip: Secondary | ICD-10-CM | POA: Insufficient documentation

## 2021-08-25 NOTE — Therapy (Signed)
[facility] [place], [place], [address] Phone: [phone]   Fax:  [phone]  Physical Therapy Evaluation  Patient Details  Name: Jacqueline Cox MRN: [id number] Date of Birth: 02-09-1951 Referring Provider (PT): Lyla Glassing, MD   Encounter Date: 08/25/2021   PT End of Session - 08/25/21 1115     Visit Number 1    Number of Visits 8    Date for PT Re-Evaluation 10/21/21    PT Start Time 1116    PT Stop Time 1200    PT Time Calculation (min) 44 min    Activity Tolerance Patient tolerated treatment well    Behavior During Therapy [facility] for tasks assessed/performed             Past Medical History:  Diagnosis Date   Anxiety    Atypical mole 05/20/2003   Right Foot, Dorsum (slight)   Atypical mole 07/20/2003   Right Upper Arm (Minimal)   Atypical mole 07/20/2003   Right Shin (Mild)   Atypical mole 09/24/2012   Right Ant. Thigh (Severe) (widershave)   Atypical mole 10/10/2012   Right Thigh (Mild)   BCC (basal cell carcinoma of skin) 09/24/2002   Upper Left Lip (MOH's)   Condyloma acuminata    COVID 07/2020   Diabetes mellitus without complication ([place]) 04/5850   type 2   Hypertension late 76s   Psoriasis age 60   hair line, pubic, hands   Superficial basal cell carcinoma (BCC) 06/20/2011   Right Side Nose ([place])   Superficial basal cell carcinoma (BCC) 06/02/2014   Right Side Nose    Past Surgical History:  Procedure Laterality Date   ABDOMINAL HYSTERECTOMY  1990   TAH, AUB, ? endometriosis, cervical dysplasia   APPENDECTOMY     done at the time of hysterectomy   BREAST SURGERY [place]   benign BX   CERVICAL DISCECTOMY  1994   C 4-5   INCISION / DEBRIDEMENT BONE ELBOW Right 2001   OPEN REPAIR PERIARTICULAR FRACTURE / DISLOCATION ELBOW Right 2000 for 3 surgeries   secondary to a fall   SHOULDER DEBRIDEMENT Left 07/2009   bone spur   TUBAL LIGATION  1986    There were no vitals filed  for this visit.    Subjective Assessment - 08/25/21 1118     Subjective Patient reports that her left hip has been bothering her off and on for the last few years. However, she feels that this episode was started by a fall last October. She notes that it had steadily got worse until February until she saw her physicain regarding this pain.    Pertinent History OA, chronic low back pain    Limitations Standing;Walking;House hold activities    Patient Stated Goals sleep better (2-3 hours prior to the pain waking her up), walk longer, improved strength, and reduced pain    Currently in Pain? Yes    Pain Score 5     Pain Location Hip    Pain Orientation Left;Lateral    Pain Descriptors / Indicators Aching    Pain Type Chronic pain    Pain Radiating Towards to left lateral knee    Pain Onset More than a month ago    Pain Frequency Constant    Aggravating Factors  prolonged standing and walking, laying down to sleep (primarily on her right side)    Pain Relieving Factors heat, medication    Effect of Pain on Daily Activities she has to  sit and take a break after prolonged standing and walking                [facility] PT Assessment - 08/25/21 0001       Assessment   Medical Diagnosis OA of left hip joint    Referring Provider (PT) Swinteck, MD    Onset Date/Surgical Date --   on and off for years   Next MD Visit as needed    Prior Therapy No      Precautions   Precautions None      Restrictions   Weight Bearing Restrictions No      Balance Screen   Has the patient fallen in the past 6 months No    Has the patient had a decrease in activity level because of a fear of falling?  No    Is the patient reluctant to leave their home because of a fear of falling?  No      Home Ecologist residence    Living Arrangements Spouse/significant other    Home Access Stairs to enter    Entrance Stairs-Number of Steps 4   painful; will do step to pattern with  hip is hurting   Entrance Stairs-Rails Can reach both    Home Layout Two level      Prior Function   Level of Independence Independent    Leisure walk (couple miles prior to her hip pain), gardening, feed animals, exercise classes      Cognition   Overall Cognitive Status Within Functional Limits for tasks assessed    Attention Focused    Focused Attention Appears intact    Memory Appears intact    Awareness Appears intact    Problem Solving Appears intact      Observation/Other Assessments   Focus on Therapeutic Outcomes (FOTO)  53.42      Sensation   Additional Comments Patient reports no numbness or tingling      ROM / Strength   AROM / PROM / Strength Strength;AROM      AROM   AROM Assessment Site Hip    Right/Left Hip Left;Right    Right Hip Flexion 118    Right Hip ABduction 35    Left Hip Flexion 120    Left Hip ABduction 21   discomfort in hip and low back     Strength   Strength Assessment Site Hip;Knee    Right/Left Hip Right;Left    Right Hip Flexion 4/5   pulling in left groin   Right Hip ADduction 4+/5    Left Hip Flexion 4-/5    Left Hip ADduction 4+/5   painful and pulling in left groin   Right/Left Knee Right;Left    Right Knee Flexion 4+/5    Right Knee Extension 5/5    Left Knee Flexion 4+/5    Left Knee Extension 5/5      Palpation   Palpation comment TTP: left TFL (familiar radiating pain), IT band (familiar radiating pain), piriformis, and hamstring                        Objective measurements completed on examination: See above findings.       [facility] Adult PT Treatment/Exercise - 08/25/21 0001       Manual Therapy   Manual Therapy Soft tissue mobilization    Soft tissue mobilization left TFL and IT band  PT Long Term Goals - 08/25/21 1226       PT LONG TERM GOAL #1   Title Patient will be independent with her HEP.    Time 4    Period Weeks    Status New    Target Date  09/22/21      PT LONG TERM GOAL #2   Title Patient will be able to complete her daily activities without her familair hip pain exceeding a 3/10.    Time 4    Period Weeks    Status New    Target Date 09/22/21      PT LONG TERM GOAL #3   Title Patient will report being able to sleep at least 4 hours prior to waking up due to her left hip pain.    Time 4    Period Weeks    Status New    Target Date 09/22/21      PT LONG TERM GOAL #4   Title Patient will be able to navigate at least 4 steps in a reciprocal pattern without being limited by her left hip pain.    Time 4    Period Weeks    Status New    Target Date 09/22/21                    Plan - 08/25/21 1217     Clinical Impression Statement Patient is a 71 year old female presenting to physical therapy with chronic left lateral hip pain radiating along her Illiotibial band following a fall in October 2022. She presented with low to moderate pain severity with low irritability. Palpation along her left TFL and IT band were able to significant reproduce her familiar symptoms. She also exhibited reduced hip strength. Recommend that she continue with skilled physical therapy to address her remaining impairments to return to her prior level of function.    Personal Factors and Comorbidities Time since onset of injury/illness/exacerbation;Comorbidity 2;Other    Comorbidities DM, HTN    Examination-Activity Limitations Locomotion Level;Stairs    Examination-Participation Restrictions Cleaning;Community Activity;Yard Work    Stability/Clinical Decision Making Stable/Uncomplicated    Clinical Decision Making Low    Rehab Potential Good    PT Frequency 2x / week    PT Duration 4 weeks    PT Treatment/Interventions Cryotherapy;Electrical Stimulation;Moist Heat;Neuromuscular re-education;Therapeutic exercise;Therapeutic activities;Functional mobility training;Stair training;Patient/family education;Manual techniques;Dry  needling;Taping    PT Next Visit Plan recumbent bike, sidelying hip abduction, hip ADD isometric, and modalities as needed    Consulted and Agree with Plan of Care Patient             Patient will benefit from skilled therapeutic intervention in order to improve the following deficits and impairments:  Decreased range of motion, Difficulty walking, Pain, Decreased activity tolerance, Decreased strength, Decreased mobility  Visit Diagnosis: Pain in left hip     Problem List Patient Active Problem List   Diagnosis Date Noted   Seasonal allergies 11/26/2018   Gastroesophageal reflux disease 07/29/2018   Diabetes mellitus, type 2 ([place]) 06/11/2012   HLD (hyperlipidemia) 06/11/2012   BP (high blood pressure) 06/11/2012   Hyperlipidemia associated with type 2 diabetes mellitus ([place]) 06/11/2012   Rationale for Evaluation and Treatment Rehabilitation   Darlin Coco, PT 08/25/2021, 12:37 PM  [facility] [address] [place], [place], [address] Phone: [phone]   Fax:  [phone]  Name: Jacqueline Cox MRN: [id number] Date of Birth: [birth year]

## 2021-08-29 ENCOUNTER — Ambulatory Visit: Payer: Medicare HMO

## 2021-08-29 DIAGNOSIS — M25552 Pain in left hip: Secondary | ICD-10-CM

## 2021-08-29 NOTE — Therapy (Signed)
[facility] [place], [place], [address] Phone: [phone]   Fax:  [phone]  Physical Therapy Treatment  Patient Details  Name: NICKISHA HUM MRN: [id number] Date of Birth: 19-Oct-1950 Referring Provider (PT): Lyla Glassing, MD   Encounter Date: 08/29/2021   PT End of Session - 08/29/21 1130     Visit Number 2    Number of Visits 8    Date for PT Re-Evaluation 10/21/21    PT Start Time 1115    PT Stop Time 1210    PT Time Calculation (min) 55 min    Activity Tolerance Patient tolerated treatment well    Behavior During Therapy [facility] for tasks assessed/performed             Past Medical History:  Diagnosis Date   Anxiety    Atypical mole 05/20/2003   Right Foot, Dorsum (slight)   Atypical mole 07/20/2003   Right Upper Arm (Minimal)   Atypical mole 07/20/2003   Right Shin (Mild)   Atypical mole 09/24/2012   Right Ant. Thigh (Severe) (widershave)   Atypical mole 10/10/2012   Right Thigh (Mild)   BCC (basal cell carcinoma of skin) 09/24/2002   Upper Left Lip (MOH's)   Condyloma acuminata    COVID 07/2020   Diabetes mellitus without complication ([place]) 88/5027   type 2   Hypertension late 6s   Psoriasis age 48   hair line, pubic, hands   Superficial basal cell carcinoma (BCC) 06/20/2011   Right Side Nose ([place])   Superficial basal cell carcinoma (BCC) 06/02/2014   Right Side Nose    Past Surgical History:  Procedure Laterality Date   ABDOMINAL HYSTERECTOMY  1990   TAH, AUB, ? endometriosis, cervical dysplasia   APPENDECTOMY     done at the time of hysterectomy   BREAST SURGERY [place]   benign BX   CERVICAL DISCECTOMY  1994   C 4-5   INCISION / DEBRIDEMENT BONE ELBOW Right 2001   OPEN REPAIR PERIARTICULAR FRACTURE / DISLOCATION ELBOW Right 2000 for 3 surgeries   secondary to a fall   SHOULDER DEBRIDEMENT Left 07/2009   bone spur   TUBAL LIGATION  1986    There were no vitals filed  for this visit.   Subjective Assessment - 08/29/21 1128     Subjective Pt arrives for today's treamtent session reporting 2/10 left low back and left low extremity pain.    Pertinent History OA, chronic low back pain    Limitations Standing;Walking;House hold activities    Patient Stated Goals sleep better (2-3 hours prior to the pain waking her up), walk longer, improved strength, and reduced pain    Currently in Pain? Yes    Pain Score 2     Pain Location Hip    Pain Orientation Left    Pain Onset More than a month ago                               [facility] Adult PT Treatment/Exercise - 08/29/21 0001       Exercises   Exercises Knee/Hip      Knee/Hip Exercises: Aerobic   Recumbent Bike Lvl 2 x 15 mins      Knee/Hip Exercises: Standing   Heel Raises Both;20 reps    Heel Raises Limitations Toe Raises x 20 reps    Hip Flexion Both;20 reps;Knee straight    Hip Abduction Both;20  reps;Knee straight    Hip Extension Both;20 reps;Knee straight    Rocker Board 3 minutes      Modalities   Modalities Electrical Stimulation;Moist Heat      Moist Heat Therapy   Number Minutes Moist Heat 15 Minutes    Moist Heat Location Lumbar Spine      Electrical Stimulation   Electrical Stimulation Location Lumbar Spine   in sitting   Electrical Stimulation Action IFC    Electrical Stimulation Parameters 80-150 Hz x 15 mins    Electrical Stimulation Goals Pain;Tone      Manual Therapy   Manual Therapy Soft tissue mobilization    Soft tissue mobilization left TFL and IT band                          PT Long Term Goals - 08/25/21 1226       PT LONG TERM GOAL #1   Title Patient will be independent with her HEP.    Time 4    Period Weeks    Status New    Target Date 09/22/21      PT LONG TERM GOAL #2   Title Patient will be able to complete her daily activities without her familair hip pain exceeding a 3/10.    Time 4    Period Weeks    Status New     Target Date 09/22/21      PT LONG TERM GOAL #3   Title Patient will report being able to sleep at least 4 hours prior to waking up due to her left hip pain.    Time 4    Period Weeks    Status New    Target Date 09/22/21      PT LONG TERM GOAL #4   Title Patient will be able to navigate at least 4 steps in a reciprocal pattern without being limited by her left hip pain.    Time 4    Period Weeks    Status New    Target Date 09/22/21                   Plan - 08/29/21 1130     Clinical Impression Statement Pt arrives for today's treatment session reporting 2/10 left low back and left low extremity pain.  Pt introduced to standing hip exercises to increase strength and function.  Pt requiring min cues for proper technique and posture with all newly added exercises.  Pt instructed to perform all standing exercises at home to tolerance; pt declined paper copy of HEP.  STW/M performed to left hip and IT band to decrease pain and tone.  Pt given tennis ball to take home for STW performance.  Normal responses to estim and MH noted upon removal.  Pt reported 0/10 left hip and low back pain at completion of today's treatment session.    Personal Factors and Comorbidities Time since onset of injury/illness/exacerbation;Comorbidity 2;Other    Comorbidities DM, HTN    Examination-Activity Limitations Locomotion Level;Stairs    Examination-Participation Restrictions Cleaning;Community Activity;Yard Work    Stability/Clinical Decision Making Stable/Uncomplicated    Rehab Potential Good    PT Frequency 2x / week    PT Duration 4 weeks    PT Treatment/Interventions Cryotherapy;Electrical Stimulation;Moist Heat;Neuromuscular re-education;Therapeutic exercise;Therapeutic activities;Functional mobility training;Stair training;Patient/family education;Manual techniques;Dry needling;Taping    PT Next Visit Plan recumbent bike, sidelying hip abduction, hip ADD isometric, and modalities as needed  Consulted and Agree with Plan of Care Patient             Patient will benefit from skilled therapeutic intervention in order to improve the following deficits and impairments:  Decreased range of motion, Difficulty walking, Pain, Decreased activity tolerance, Decreased strength, Decreased mobility  Visit Diagnosis: Pain in left hip     Problem List Patient Active Problem List   Diagnosis Date Noted   Seasonal allergies 11/26/2018   Gastroesophageal reflux disease 07/29/2018   Diabetes mellitus, type 2 ([place]) 06/11/2012   HLD (hyperlipidemia) 06/11/2012   BP (high blood pressure) 06/11/2012   Hyperlipidemia associated with type 2 diabetes mellitus ([place]) 06/11/2012   Rationale for Evaluation and Treatment Rehabilitation  Kathrynn Ducking, PTA 08/29/2021, 12:18 PM  [facility] [address] [place], [place], [address] Phone: [phone]   Fax:  [phone]  Name: NETTYE FLEGAL MRN: [id number] Date of Birth: 1950/05/04

## 2021-09-02 ENCOUNTER — Ambulatory Visit: Payer: Medicare HMO

## 2021-09-02 DIAGNOSIS — M25552 Pain in left hip: Secondary | ICD-10-CM | POA: Diagnosis not present

## 2021-09-02 NOTE — Therapy (Signed)
[facility] [place], [place], [address] Phone: [phone]   Fax:  [phone]  Physical Therapy Treatment  Patient Details  Name: [name] MRN: [id number] Date of Birth: 1950-09-27 Referring Provider (PT): Lyla Glassing, MD   Encounter Date: 09/02/2021   PT End of Session - 09/02/21 1112     Visit Number 3    Number of Visits 8    Date for PT Re-Evaluation 10/21/21    PT Start Time 1108    PT Stop Time 1202    PT Time Calculation (min) 54 min    Activity Tolerance Patient tolerated treatment well    Behavior During Therapy [facility] for tasks assessed/performed             Past Medical History:  Diagnosis Date   Anxiety    Atypical mole 05/20/2003   Right Foot, Dorsum (slight)   Atypical mole 07/20/2003   Right Upper Arm (Minimal)   Atypical mole 07/20/2003   Right Shin (Mild)   Atypical mole 09/24/2012   Right Ant. Thigh (Severe) (widershave)   Atypical mole 10/10/2012   Right Thigh (Mild)   BCC (basal cell carcinoma of skin) 09/24/2002   Upper Left Lip (MOH's)   Condyloma acuminata    COVID 07/2020   Diabetes mellitus without complication ([place]) 23/5361   type 2   Hypertension late 77s   Psoriasis age 88   hair line, pubic, hands   Superficial basal cell carcinoma (BCC) 06/20/2011   Right Side Nose ([place])   Superficial basal cell carcinoma (BCC) 06/02/2014   Right Side Nose    Past Surgical History:  Procedure Laterality Date   ABDOMINAL HYSTERECTOMY  1990   TAH, AUB, ? endometriosis, cervical dysplasia   APPENDECTOMY     done at the time of hysterectomy   BREAST SURGERY [place]   benign BX   CERVICAL DISCECTOMY  1994   C 4-5   INCISION / DEBRIDEMENT BONE ELBOW Right 2001   OPEN REPAIR PERIARTICULAR FRACTURE / DISLOCATION ELBOW Right 2000 for 3 surgeries   secondary to a fall   SHOULDER DEBRIDEMENT Left 07/2009   bone spur   TUBAL LIGATION  1986    There were no vitals filed  for this visit.   Subjective Assessment - 09/02/21 1111     Subjective Patient reports that her hip is not hurting this morning, but her back has been aching a little recently.    Pertinent History OA, chronic low back pain    Limitations Standing;Walking;House hold activities    Patient Stated Goals sleep better (2-3 hours prior to the pain waking her up), walk longer, improved strength, and reduced pain    Currently in Pain? Yes    Pain Score 3     Pain Location Back    Pain Descriptors / Indicators Aching    Pain Onset More than a month ago                               [facility] Adult PT Treatment/Exercise - 09/02/21 0001       Knee/Hip Exercises: Aerobic   Recumbent Bike L2 x 15 minutes      Knee/Hip Exercises: Standing   Hip Flexion Both;10 reps;Knee straight    Forward Lunges Both;20 reps   onto 6" step   Hip Abduction Both;20 reps;Knee straight    Rocker Board 4 minutes  Modalities   Modalities Electrical Stimulation;Moist Heat   no redness or adverse reaction to today's modalities     Moist Heat Therapy   Number Minutes Moist Heat 10 Minutes    Moist Heat Location Lumbar Spine      Electrical Stimulation   Electrical Stimulation Location Bilateral gluteals    Electrical Stimulation Action IFC    Electrical Stimulation Parameters 80-150 Hz w/ 40% scan x 10 minutes    Electrical Stimulation Goals Pain      Manual Therapy   Manual Therapy Soft tissue mobilization;Manual Traction    Soft tissue mobilization left TFL and IT band    Manual Traction left hip long axis distraction                          PT Long Term Goals - 08/25/21 1226       PT LONG TERM GOAL #1   Title Patient will be independent with her HEP.    Time 4    Period Weeks    Status New    Target Date 09/22/21      PT LONG TERM GOAL #2   Title Patient will be able to complete her daily activities without her familair hip pain exceeding a 3/10.    Time 4     Period Weeks    Status New    Target Date 09/22/21      PT LONG TERM GOAL #3   Title Patient will report being able to sleep at least 4 hours prior to waking up due to her left hip pain.    Time 4    Period Weeks    Status New    Target Date 09/22/21      PT LONG TERM GOAL #4   Title Patient will be able to navigate at least 4 steps in a reciprocal pattern without being limited by her left hip pain.    Time 4    Period Weeks    Status New    Target Date 09/22/21                   Plan - 09/02/21 1113     Clinical Impression Statement Treatment focused on familiar interventions for light hip abductor engagement. She required minimal cueing with lunges for proper exercise performance to facilitate improved hip mobility. Manual therapy focused on soft tissue mobilization to the left IT band and long axis distraction to the left lower extremity with this being the most effective at reducing her familair discomfort. She reported that her hip felt good as she was experiencing no pain or discomfort upon the conclusion of treatment. She continues to require skilled physical therapy to address her remaining impairments to return to her prior level of function.    Personal Factors and Comorbidities Time since onset of injury/illness/exacerbation;Comorbidity 2;Other    Comorbidities DM, HTN    Examination-Activity Limitations Locomotion Level;Stairs    Examination-Participation Restrictions Cleaning;Community Activity;Yard Work    Stability/Clinical Decision Making Stable/Uncomplicated    Rehab Potential Good    PT Frequency 2x / week    PT Duration 4 weeks    PT Treatment/Interventions Cryotherapy;Electrical Stimulation;Moist Heat;Neuromuscular re-education;Therapeutic exercise;Therapeutic activities;Functional mobility training;Stair training;Patient/family education;Manual techniques;Dry needling;Taping    PT Next Visit Plan recumbent bike, sidelying hip abduction, hip ADD  isometric, and modalities as needed    Consulted and Agree with Plan of Care Patient  Patient will benefit from skilled therapeutic intervention in order to improve the following deficits and impairments:  Decreased range of motion, Difficulty walking, Pain, Decreased activity tolerance, Decreased strength, Decreased mobility  Visit Diagnosis: Pain in left hip     Problem List Patient Active Problem List   Diagnosis Date Noted   Seasonal allergies 11/26/2018   Gastroesophageal reflux disease 07/29/2018   Diabetes mellitus, type 2 ([place]) 06/11/2012   HLD (hyperlipidemia) 06/11/2012   BP (high blood pressure) 06/11/2012   Hyperlipidemia associated with type 2 diabetes mellitus ([place]) 06/11/2012   Rationale for Evaluation and Treatment Rehabilitation   Darlin Coco, PT 09/02/2021, 12:30 PM  [facility] [address] [place], [place], [address] Phone: [phone]   Fax:  [phone]  Name: Jacqueline Cox MRN: [id number] Date of Birth: 12-31-1950

## 2021-09-06 ENCOUNTER — Ambulatory Visit: Payer: Medicare HMO

## 2021-09-06 DIAGNOSIS — M25552 Pain in left hip: Secondary | ICD-10-CM | POA: Diagnosis not present

## 2021-09-06 NOTE — Therapy (Signed)
[facility] [place], [place], [address] Phone: [phone]   Fax:  [phone]  Physical Therapy Treatment  Patient Details  Name: Jacqueline Cox MRN: [id number] Date of Birth: [birth year] Referring Provider (PT): Lyla Glassing, MD   Encounter Date: 09/06/2021   PT End of Session - 09/06/21 1118     Visit Number 4    Number of Visits 8    Date for PT Re-Evaluation 10/21/21    PT Start Time 1116    PT Stop Time 1200    PT Time Calculation (min) 44 min    Activity Tolerance Patient tolerated treatment well    Behavior During Therapy [facility] for tasks assessed/performed             Past Medical History:  Diagnosis Date   Anxiety    Atypical mole 05/20/2003   Right Foot, Dorsum (slight)   Atypical mole 07/20/2003   Right Upper Arm (Minimal)   Atypical mole 07/20/2003   Right Shin (Mild)   Atypical mole 09/24/2012   Right Ant. Thigh (Severe) (widershave)   Atypical mole 10/10/2012   Right Thigh (Mild)   BCC (basal cell carcinoma of skin) 09/24/2002   Upper Left Lip (MOH's)   Condyloma acuminata    COVID 07/2020   Diabetes mellitus without complication ([place]) 42/3536   type 2   Hypertension late 50s   Psoriasis age 70   hair line, pubic, hands   Superficial basal cell carcinoma (BCC) 06/20/2011   Right Side Nose ([place]yclara)   Superficial basal cell carcinoma (BCC) 06/02/2014   Right Side Nose    Past Surgical History:  Procedure Laterality Date   ABDOMINAL HYSTERECTOMY  1990   TAH, AUB, ? endometriosis, cervical dysplasia   APPENDECTOMY     done at the time of hysterectomy   BREAST SURGERY [place]   benign BX   CERVICAL DISCECTOMY  1994   C 4-5   INCISION / DEBRIDEMENT BONE ELBOW Right 2001   OPEN REPAIR PERIARTICULAR FRACTURE / DISLOCATION ELBOW Right 2000 for 3 surgeries   secondary to a fall   SHOULDER DEBRIDEMENT Left 07/2009   bone spur   TUBAL LIGATION  1986    There were no vitals filed  for this visit.   Subjective Assessment - 09/06/21 1118     Subjective Patient reports that her right knee started hurting yesterday while shopping. She notes that it was really painful straightening her knee. She notes that her hip feels better. However, her back was really sore after her last appointment.    Pertinent History OA, chronic low back pain    Limitations Standing;Walking;House hold activities    Patient Stated Goals sleep better (2-3 hours prior to the pain waking her up), walk longer, improved strength, and reduced pain    Currently in Pain? Yes    Pain Score 4     Pain Location Knee    Pain Orientation Right    Pain Onset More than a month ago                               [facility] Adult PT Treatment/Exercise - 09/06/21 0001       Knee/Hip Exercises: Stretches   Other Knee/Hip Stretches Double knee to chest   2.5 minutes   Other Knee/Hip Stretches Lower trunk rotation   2.5 minutes     Knee/Hip Exercises: Land  5 minutes    Other Standing Knee Exercises Side stepping   2 minutes; on foam   Other Standing Knee Exercises BOSU rocking   3 minutes     Knee/Hip Exercises: Supine   Bridges Both   2 minutes   Straight Leg Raises Both;2 sets;15 reps                     PT Education - 09/06/21 1216     Education Details healing, heat vs ice    Person(s) Educated Patient    Methods Explanation    Comprehension Verbalized understanding                 PT Long Term Goals - 08/25/21 1226       PT LONG TERM GOAL #1   Title Patient will be independent with her HEP.    Time 4    Period Weeks    Status New    Target Date 09/22/21      PT LONG TERM GOAL #2   Title Patient will be able to complete her daily activities without her familair hip pain exceeding a 3/10.    Time 4    Period Weeks    Status New    Target Date 09/22/21      PT LONG TERM GOAL #3   Title Patient will report being able to sleep at  least 4 hours prior to waking up due to her left hip pain.    Time 4    Period Weeks    Status New    Target Date 09/22/21      PT LONG TERM GOAL #4   Title Patient will be able to navigate at least 4 steps in a reciprocal pattern without being limited by her left hip pain.    Time 4    Period Weeks    Status New    Target Date 09/22/21                   Plan - 09/06/21 1121     Clinical Impression Statement Patient presented to treatment with increased right knee pain which limited her ability to be progressed with standing interventions. She was introduced to multiple new supine interventions for improved lumbopelvic mobility and strength. She experienced no increase in her familiar hip pain with any of today's interventions. She reported that her hip felt good upon the conclusion of treatment, but her knee was still hurting. She continues to require skilled physical therapy to address her remaining impairments to return to her prior level of function.    Personal Factors and Comorbidities Time since onset of injury/illness/exacerbation;Comorbidity 2;Other    Comorbidities DM, HTN    Examination-Activity Limitations Locomotion Level;Stairs    Examination-Participation Restrictions Cleaning;Community Activity;Yard Work    Stability/Clinical Decision Making Stable/Uncomplicated    Rehab Potential Good    PT Frequency 2x / week    PT Duration 4 weeks    PT Treatment/Interventions Cryotherapy;Electrical Stimulation;Moist Heat;Neuromuscular re-education;Therapeutic exercise;Therapeutic activities;Functional mobility training;Stair training;Patient/family education;Manual techniques;Dry needling;Taping    PT Next Visit Plan recumbent bike, sidelying hip abduction, hip ADD isometric, and modalities as needed    Consulted and Agree with Plan of Care Patient             Patient will benefit from skilled therapeutic intervention in order to improve the following deficits and  impairments:  Decreased range of motion, Difficulty walking, Pain, Decreased activity tolerance, Decreased strength, Decreased mobility  Visit Diagnosis: Pain in left hip     Problem List Patient Active Problem List   Diagnosis Date Noted   Seasonal allergies 11/26/2018   Gastroesophageal reflux disease 07/29/2018   Diabetes mellitus, type 2 ([place]) 06/11/2012   HLD (hyperlipidemia) 06/11/2012   BP (high blood pressure) 06/11/2012   Hyperlipidemia associated with type 2 diabetes mellitus ([place]) 06/11/2012   Rationale for Evaluation and Treatment Rehabilitation   Darlin Coco, PT 09/06/2021, 12:17 PM  [facility] [address] [place], [place], [address] Phone: [phone]   Fax:  [phone]  Name: KIMA MALENFANT MRN: [id number] Date of Birth: 19-Jan-1951

## 2021-09-09 ENCOUNTER — Ambulatory Visit: Payer: Medicare HMO | Admitting: Physical Therapy

## 2021-09-09 DIAGNOSIS — M25552 Pain in left hip: Secondary | ICD-10-CM | POA: Diagnosis not present

## 2021-09-09 NOTE — Therapy (Signed)
[facility] [place], [place], [address] Phone: [phone]   Fax:  [phone]  Physical Therapy Treatment  Patient Details  Name: Jacqueline Cox MRN: [id number] Date of Birth: 08-27-1950 Referring Provider (PT): Lyla Glassing, MD   Encounter Date: 09/09/2021   PT End of Session - 09/09/21 1254     Visit Number 5    Number of Visits 8    Date for PT Re-Evaluation 10/21/21    PT Start Time 1115    PT Stop Time 1208    PT Time Calculation (min) 53 min    Activity Tolerance Patient tolerated treatment well    Behavior During Therapy [facility] for tasks assessed/performed             Past Medical History:  Diagnosis Date   Anxiety    Atypical mole 05/20/2003   Right Foot, Dorsum (slight)   Atypical mole 07/20/2003   Right Upper Arm (Minimal)   Atypical mole 07/20/2003   Right Shin (Mild)   Atypical mole 09/24/2012   Right Ant. Thigh (Severe) (widershave)   Atypical mole 10/10/2012   Right Thigh (Mild)   BCC (basal cell carcinoma of skin) 09/24/2002   Upper Left Lip (MOH's)   Condyloma acuminata    COVID 07/2020   Diabetes mellitus without complication ([place]) 38/4665   type 2   Hypertension late 83s   Psoriasis age 9   hair line, pubic, hands   Superficial basal cell carcinoma (BCC) 06/20/2011   Right Side Nose ([place])   Superficial basal cell carcinoma (BCC) 06/02/2014   Right Side Nose    Past Surgical History:  Procedure Laterality Date   ABDOMINAL HYSTERECTOMY  1990   TAH, AUB, ? endometriosis, cervical dysplasia   APPENDECTOMY     done at the time of hysterectomy   BREAST SURGERY [place]   benign BX   CERVICAL DISCECTOMY  1994   C 4-5   INCISION / DEBRIDEMENT BONE ELBOW Right 2001   OPEN REPAIR PERIARTICULAR FRACTURE / DISLOCATION ELBOW Right 2000 for 3 surgeries   secondary to a fall   SHOULDER DEBRIDEMENT Left 07/2009   bone spur   TUBAL LIGATION  1986    There were no vitals filed  for this visit.   Subjective Assessment - 09/09/21 1255     Subjective Doing better but cannot sleep through the night due to pain.    Pertinent History OA, chronic low back pain    Limitations Standing;Walking;House hold activities    Patient Stated Goals sleep better (2-3 hours prior to the pain waking her up), walk longer, improved strength, and reduced pain    Currently in Pain? Yes    Pain Score 3     Pain Location Hip    Pain Orientation Left    Pain Descriptors / Indicators Aching    Pain Type Chronic pain    Pain Onset More than a month ago                               [facility] Adult PT Treatment/Exercise - 09/09/21 0001       Modalities   Modalities Electrical Stimulation;Ultrasound      Electrical Stimulation   Electrical Stimulation Location Left lateral hip.    Electrical Stimulation Action IFC at 80-150 Hz.    Electrical Stimulation Parameters 40% scan x 20 minutes.    Electrical Stimulation Goals Pain;Tone  Ultrasound   Ultrasound Location Left lateral hip.    Ultrasound Parameters Combo e'stim/US at 1.50 W/CM2 x 12 minutes.    Ultrasound Goals Pain      Manual Therapy   Manual Therapy Soft tissue mobilization    Soft tissue mobilization STW/M x 11 minutes to patient's left lateral hip musculature and greater trochanteric region with ischemic release technique to her TFL.                          PT Long Term Goals - 08/25/21 1226       PT LONG TERM GOAL #1   Title Patient will be independent with her HEP.    Time 4    Period Weeks    Status New    Target Date 09/22/21      PT LONG TERM GOAL #2   Title Patient will be able to complete her daily activities without her familair hip pain exceeding a 3/10.    Time 4    Period Weeks    Status New    Target Date 09/22/21      PT LONG TERM GOAL #3   Title Patient will report being able to sleep at least 4 hours prior to waking up due to her left hip pain.    Time  4    Period Weeks    Status New    Target Date 09/22/21      PT LONG TERM GOAL #4   Title Patient will be able to navigate at least 4 steps in a reciprocal pattern without being limited by her left hip pain.    Time 4    Period Weeks    Status New    Target Date 09/22/21                   Plan - 09/09/21 1253     Clinical Impression Statement Patienfound to be tender and taut to palpation over her left TFL  She did very well with treament today and would like to try dry needling.  Consent form provided to patient.    Personal Factors and Comorbidities Time since onset of injury/illness/exacerbation;Comorbidity 2;Other    Comorbidities DM, HTN    Examination-Activity Limitations Locomotion Level;Stairs    Examination-Participation Restrictions Cleaning;Community Activity;Yard Work    Rehab Potential Good    PT Frequency 2x / week    PT Duration 4 weeks    PT Treatment/Interventions Cryotherapy;Electrical Stimulation;Moist Heat;Neuromuscular re-education;Therapeutic exercise;Therapeutic activities;Functional mobility training;Stair training;Patient/family education;Manual techniques;Dry needling;Taping;Ultrasound    PT Next Visit Plan recumbent bike, sidelying hip abduction, hip ADD isometric, and modalities as needed    Consulted and Agree with Plan of Care Patient             Patient will benefit from skilled therapeutic intervention in order to improve the following deficits and impairments:  Decreased range of motion, Difficulty walking, Pain, Decreased activity tolerance, Decreased strength, Decreased mobility  Visit Diagnosis: Pain in left hip     Problem List Patient Active Problem List   Diagnosis Date Noted   Seasonal allergies 11/26/2018   Gastroesophageal reflux disease 07/29/2018   Diabetes mellitus, type 2 ([place]) 06/11/2012   HLD (hyperlipidemia) 06/11/2012   BP (high blood pressure) 06/11/2012   Hyperlipidemia associated with type 2 diabetes  mellitus ([place]) 06/11/2012   Rationale for Evaluation and Treatment Rehabilitation.  Trudi Morgenthaler, [place], PT 09/09/2021, 1:02 PM  [facility] [address]  [place], [place], [address] Phone: [phone]   Fax:  [phone]  Name: Jacqueline Cox MRN: [id number] Date of Birth: 1950/10/18

## 2021-09-13 ENCOUNTER — Ambulatory Visit: Payer: Medicare HMO | Admitting: Physical Therapy

## 2021-09-13 DIAGNOSIS — M25552 Pain in left hip: Secondary | ICD-10-CM

## 2021-09-13 NOTE — Therapy (Signed)
[facility] [place], [place], [address] Phone: [phone]   Fax:  [phone]  Physical Therapy Treatment  Patient Details  Name: SOLEIL MAS MRN: [id number] Date of Birth: [birth year] Referring Provider (PT): Lyla Glassing, MD   Encounter Date: 09/13/2021   PT End of Session - 09/13/21 1240     Visit Number 6    Number of Visits 8    Date for PT Re-Evaluation 10/21/21    PT Start Time 1033    PT Stop Time 1125    PT Time Calculation (min) 52 min    Activity Tolerance Patient tolerated treatment well    Behavior During Therapy [facility] for tasks assessed/performed             Past Medical History:  Diagnosis Date   Anxiety    Atypical mole 05/20/2003   Right Foot, Dorsum (slight)   Atypical mole 07/20/2003   Right Upper Arm (Minimal)   Atypical mole 07/20/2003   Right Shin (Mild)   Atypical mole 09/24/2012   Right Ant. Thigh (Severe) (widershave)   Atypical mole 10/10/2012   Right Thigh (Mild)   BCC (basal cell carcinoma of skin) 09/24/2002   Upper Left Lip (MOH's)   Condyloma acuminata    COVID 07/2020   Diabetes mellitus without complication ([place]) 16/0737   type 2   Hypertension late 50s   Psoriasis age 54   hair line, pubic, hands   Superficial basal cell carcinoma (BCC) 06/20/2011   Right Side Nose ([place])   Superficial basal cell carcinoma (BCC) 06/02/2014   Right Side Nose    Past Surgical History:  Procedure Laterality Date   ABDOMINAL HYSTERECTOMY  1990   TAH, AUB, ? endometriosis, cervical dysplasia   APPENDECTOMY     done at the time of hysterectomy   BREAST SURGERY [place]   benign BX   CERVICAL DISCECTOMY  1994   C 4-5   INCISION / DEBRIDEMENT BONE ELBOW Right 2001   OPEN REPAIR PERIARTICULAR FRACTURE / DISLOCATION ELBOW Right 2000 for 3 surgeries   secondary to a fall   SHOULDER DEBRIDEMENT Left 07/2009   bone spur   TUBAL LIGATION  1986    There were no vitals filed  for this visit.   Subjective Assessment - 09/13/21 1241     Subjective Sore after last treatment but better today.  Still having trouble sleeping.    Pertinent History OA, chronic low back pain    Limitations Standing;Walking;House hold activities    Patient Stated Goals sleep better (2-3 hours prior to the pain waking her up), walk longer, improved strength, and reduced pain    Currently in Pain? Yes    Pain Score 3     Pain Orientation Left    Pain Descriptors / Indicators Aching    Pain Type Chronic pain    Pain Onset More than a month ago                               [facility] Adult PT Treatment/Exercise - 09/13/21 0001       Exercises   Exercises Knee/Hip      Knee/Hip Exercises: Aerobic   Nustep Level 3 x 8 minutes.      Modalities   Modalities Electrical Stimulation      Electrical Stimulation   Electrical Stimulation Location Left lateral hip.    Electrical Stimulation Action IFC at 80-150  Hz.    Electrical Stimulation Parameters 40% scan x 20 minutes.    Electrical Stimulation Goals Pain;Tone      Manual Therapy   Manual Therapy Soft tissue mobilization    Soft tissue mobilization STW/M x 15 minutes to patient's left lateral hip musculature.              Trigger Point Dry Needling - 09/13/21 0001     Consent Given? Yes    Education Handout Provided Yes    Muscles Treated Back/Hip Tensor fascia lata;Gluteus medius   Left side.                       PT Long Term Goals - 08/25/21 1226       PT LONG TERM GOAL #1   Title Patient will be independent with her HEP.    Time 4    Period Weeks    Status New    Target Date 09/22/21      PT LONG TERM GOAL #2   Title Patient will be able to complete her daily activities without her familair hip pain exceeding a 3/10.    Time 4    Period Weeks    Status New    Target Date 09/22/21      PT LONG TERM GOAL #3   Title Patient will report being able to sleep at least 4 hours  prior to waking up due to her left hip pain.    Time 4    Period Weeks    Status New    Target Date 09/22/21      PT LONG TERM GOAL #4   Title Patient will be able to navigate at least 4 steps in a reciprocal pattern without being limited by her left hip pain.    Time 4    Period Weeks    Status New    Target Date 09/22/21                   Plan - 09/13/21 1245     Clinical Impression Statement Patient did very well dry needling to her left TFL and glut med today and felt good as she was leaving the clinic today.    Personal Factors and Comorbidities Time since onset of injury/illness/exacerbation;Comorbidity 2;Other    Comorbidities DM, HTN    Examination-Activity Limitations Locomotion Level;Stairs    Examination-Participation Restrictions Cleaning;Community Activity;Yard Work    Stability/Clinical Decision Making Stable/Uncomplicated    Rehab Potential Good    PT Frequency 2x / week    PT Duration 4 weeks    PT Treatment/Interventions Cryotherapy;Electrical Stimulation;Moist Heat;Neuromuscular re-education;Therapeutic exercise;Therapeutic activities;Functional mobility training;Stair training;Patient/family education;Manual techniques;Dry needling;Taping;Ultrasound    PT Next Visit Plan recumbent bike, sidelying hip abduction, hip ADD isometric, and modalities as needed    Consulted and Agree with Plan of Care Patient             Patient will benefit from skilled therapeutic intervention in order to improve the following deficits and impairments:  Decreased range of motion, Difficulty walking, Pain, Decreased activity tolerance, Decreased strength, Decreased mobility  Visit Diagnosis: Pain in left hip     Problem List Patient Active Problem List   Diagnosis Date Noted   Seasonal allergies 11/26/2018   Gastroesophageal reflux disease 07/29/2018   Diabetes mellitus, type 2 ([place]) 06/11/2012   HLD (hyperlipidemia) 06/11/2012   BP (high blood pressure)  06/11/2012   Hyperlipidemia associated with type 2 diabetes mellitus ([place])  06/11/2012   Rationale for Evaluation and Treatment Rehabilitation.  Jourdyn Ferrin, [place], PT 09/13/2021, 12:47 PM  [facility] [place], [place], [address] Phone: [phone]   Fax:  [phone]  Name: SUZETTA TIMKO MRN: [id number] Date of Birth: 12-19-1950

## 2021-09-15 ENCOUNTER — Ambulatory Visit: Payer: Medicare HMO | Admitting: Physical Therapy

## 2021-09-15 DIAGNOSIS — M25552 Pain in left hip: Secondary | ICD-10-CM

## 2021-09-15 NOTE — Therapy (Signed)
[facility] [place], [place], [address] Phone: [phone]   Fax:  [phone]  Physical Therapy Treatment  Patient Details  Name: Jacqueline Cox MRN: [id number] Date of Birth: 1950/09/27 Referring Provider (PT): Lyla Glassing, MD   Encounter Date: 09/15/2021   PT End of Session - 09/15/21 1513     Visit Number 7    Number of Visits 8    Date for PT Re-Evaluation 10/21/21    PT Start Time 0230    PT Stop Time 0324    PT Time Calculation (min) 54 min    Activity Tolerance Patient tolerated treatment well    Behavior During Therapy [facility] for tasks assessed/performed             Past Medical History:  Diagnosis Date   Anxiety    Atypical mole 05/20/2003   Right Foot, Dorsum (slight)   Atypical mole 07/20/2003   Right Upper Arm (Minimal)   Atypical mole 07/20/2003   Right Shin (Mild)   Atypical mole 09/24/2012   Right Ant. Thigh (Severe) (widershave)   Atypical mole 10/10/2012   Right Thigh (Mild)   BCC (basal cell carcinoma of skin) 09/24/2002   Upper Left Lip (MOH's)   Condyloma acuminata    COVID 07/2020   Diabetes mellitus without complication ([place]) 12/6267   type 2   Hypertension late 26s   Psoriasis age 60   hair line, pubic, hands   Superficial basal cell carcinoma (BCC) 06/20/2011   Right Side Nose ([place])   Superficial basal cell carcinoma (BCC) 06/02/2014   Right Side Nose    Past Surgical History:  Procedure Laterality Date   ABDOMINAL HYSTERECTOMY  1990   TAH, AUB, ? endometriosis, cervical dysplasia   APPENDECTOMY     done at the time of hysterectomy   BREAST SURGERY [place]   benign BX   CERVICAL DISCECTOMY  1994   C 4-5   INCISION / DEBRIDEMENT BONE ELBOW Right 2001   OPEN REPAIR PERIARTICULAR FRACTURE / DISLOCATION ELBOW Right 2000 for 3 surgeries   secondary to a fall   SHOULDER DEBRIDEMENT Left 07/2009   bone spur   TUBAL LIGATION  1986    There were no vitals filed  for this visit.   Subjective Assessment - 09/15/21 1518     Subjective Patient did well with dry needling.  Pain lower today.    Pertinent History OA, chronic low back pain    Limitations Standing;Walking;House hold activities    Patient Stated Goals sleep better (2-3 hours prior to the pain waking her up), walk longer, improved strength, and reduced pain    Currently in Pain? Yes    Pain Score 2     Pain Location Hip    Pain Orientation Left    Pain Descriptors / Indicators Aching;Dull    Pain Onset More than a month ago                               [facility] Adult PT Treatment/Exercise - 09/15/21 0001       Exercises   Exercises Knee/Hip      Knee/Hip Exercises: Aerobic   Nustep Level 3 x 13 minutes.      Modalities   Modalities Electrical Stimulation      Electrical Stimulation   Electrical Stimulation Location Left lateral hip.    Electrical Stimulation Action IFC at 80-150 Hz.  Electrical Stimulation Parameters 40% scan x 20 minutes.    Electrical Stimulation Goals Pain;Tone      Manual Therapy   Manual Therapy Soft tissue mobilization    Soft tissue mobilization STW/M x 10 minutes to affected left lateral hip musculature f/b left femoral traction (sustained and oscillations) x 3 minutes.                          PT Long Term Goals - 08/25/21 1226       PT LONG TERM GOAL #1   Title Patient will be independent with her HEP.    Time 4    Period Weeks    Status New    Target Date 09/22/21      PT LONG TERM GOAL #2   Title Patient will be able to complete her daily activities without her familair hip pain exceeding a 3/10.    Time 4    Period Weeks    Status New    Target Date 09/22/21      PT LONG TERM GOAL #3   Title Patient will report being able to sleep at least 4 hours prior to waking up due to her left hip pain.    Time 4    Period Weeks    Status New    Target Date 09/22/21      PT LONG TERM GOAL #4   Title  Patient will be able to navigate at least 4 steps in a reciprocal pattern without being limited by her left hip pain.    Time 4    Period Weeks    Status New    Target Date 09/22/21                   Plan - 09/15/21 1522     Clinical Impression Statement Great response to dry needling.  No pain reported after treatment.    Personal Factors and Comorbidities Time since onset of injury/illness/exacerbation;Comorbidity 2;Other    Comorbidities DM, HTN    Examination-Activity Limitations Locomotion Level;Stairs    Examination-Participation Restrictions Cleaning;Community Activity;Yard Work    Stability/Clinical Decision Making Stable/Uncomplicated    Rehab Potential Good    PT Frequency 2x / week    PT Duration 4 weeks    PT Treatment/Interventions Cryotherapy;Electrical Stimulation;Moist Heat;Neuromuscular re-education;Therapeutic exercise;Therapeutic activities;Functional mobility training;Stair training;Patient/family education;Manual techniques;Dry needling;Taping;Ultrasound    PT Next Visit Plan recumbent bike, sidelying hip abduction, hip ADD isometric, and modalities and dry needling as needed.    Consulted and Agree with Plan of Care Patient             Patient will benefit from skilled therapeutic intervention in order to improve the following deficits and impairments:  Decreased range of motion, Difficulty walking, Pain, Decreased activity tolerance, Decreased strength, Decreased mobility  Visit Diagnosis: Pain in left hip     Problem List Patient Active Problem List   Diagnosis Date Noted   Seasonal allergies 11/26/2018   Gastroesophageal reflux disease 07/29/2018   Diabetes mellitus, type 2 ([place]) 06/11/2012   HLD (hyperlipidemia) 06/11/2012   BP (high blood pressure) 06/11/2012   Hyperlipidemia associated with type 2 diabetes mellitus ([place]) 06/11/2012   Rationale for Evaluation and Treatment Rehabilitation.  Rennie Hack, [place], PT 09/15/2021, 3:44  PM  [facility] [address] [place], [place], [address] Phone: [phone]   Fax:  [phone]  Name: Jacqueline Cox MRN: [id number] Date of Birth: December 23, 1950

## 2021-09-20 ENCOUNTER — Ambulatory Visit: Payer: Medicare HMO

## 2021-09-20 DIAGNOSIS — M25552 Pain in left hip: Secondary | ICD-10-CM | POA: Diagnosis not present

## 2021-09-22 ENCOUNTER — Ambulatory Visit: Payer: Medicare HMO | Admitting: Physical Therapy

## 2021-09-22 ENCOUNTER — Encounter: Payer: Self-pay | Admitting: Physical Therapy

## 2021-09-22 DIAGNOSIS — M25552 Pain in left hip: Secondary | ICD-10-CM | POA: Diagnosis not present

## 2021-09-22 DIAGNOSIS — M5459 Other low back pain: Secondary | ICD-10-CM

## 2021-09-22 NOTE — Therapy (Addendum)
OUTPATIENT PHYSICAL THERAPY TREATMENT NOTE   Patient Name: Jacqueline Cox MRN: [id number] DOB:[birth year], 71 y.o., female Today's Date: 09/22/2021   REFERRING PROVIDER: Rod Can MD   PT End of Session - 09/22/21 1559     Visit Number 9    Number of Visits 12    Date for PT Re-Evaluation 10/21/21    PT Start Time 0945    PT Stop Time 1042    PT Time Calculation (min) 57 min    Activity Tolerance Patient tolerated treatment well    Behavior During Therapy [facility] for tasks assessed/performed             Past Medical History:  Diagnosis Date   Anxiety    Atypical mole 05/20/2003   Right Foot, Dorsum (slight)   Atypical mole 07/20/2003   Right Upper Arm (Minimal)   Atypical mole 07/20/2003   Right Shin (Mild)   Atypical mole 09/24/2012   Right Ant. Thigh (Severe) (widershave)   Atypical mole 10/10/2012   Right Thigh (Mild)   BCC (basal cell carcinoma of skin) 09/24/2002   Upper Left Lip (MOH's)   Condyloma acuminata    COVID 07/2020   Diabetes mellitus without complication ([place]) 84/6962   type 2   Hypertension late 84s   Psoriasis age 64   hair line, pubic, hands   Superficial basal cell carcinoma (BCC) 06/20/2011   Right Side Nose ([place])   Superficial basal cell carcinoma (BCC) 06/02/2014   Right Side Nose   Past Surgical History:  Procedure Laterality Date   ABDOMINAL HYSTERECTOMY  1990   TAH, AUB, ? endometriosis, cervical dysplasia   APPENDECTOMY     done at the time of hysterectomy   [place]   benign BX   CERVICAL DISCECTOMY  1994   C 4-5   INCISION / [place] Right 2001   OPEN REPAIR PERIARTICULAR FRACTURE / DISLOCATION ELBOW Right 2000 for 3 surgeries   secondary to a fall   SHOULDER DEBRIDEMENT Left 07/2009   bone spur   TUBAL LIGATION  1986   Patient Active Problem List   Diagnosis Date Noted   Seasonal allergies 11/26/2018   Gastroesophageal reflux disease 07/29/2018   Diabetes mellitus, type 2  ([place]) 06/11/2012   HLD (hyperlipidemia) 06/11/2012   BP (high blood pressure) 06/11/2012   Hyperlipidemia associated with type 2 diabetes mellitus ([place]) 06/11/2012    REFERRING DIAG: Pain in left hip, LBP.  THERAPY DIAG:  Pain in left hip  Other low back pain  Rationale for Evaluation and Treatment Rehabilitation     SUBJECTIVE: The patient presents to the clinic today with a new order for low back pain.  Her pain is of a moderate nature today but can rise to as high as a 8/10 with increased activities and bending.  When she first gets up in the morning she has increased pain until she moves around.  AROM:  Full active lumbar flexion and extension to 20 degrees.  Normal bilateral LE strength.  Bilateral Patellar and Achilles reflexes decreased.  Tender to palpation across her lower lumbar region but especially in the left lower lumbar, SIJ and upper gluteal musculature.   PAIN:  Are you having pain? Yes: NPRS scale: 3/10 Pain location: Left LB Pain description: Ache. Aggravating factors: See above. Relieving factors: Rest.     TODAY'S TREATMENT:  Patient prone over two pillows for comfort:  Trigger Point Dry-Needling  Treatment instructions: Expect mild to moderate muscle  soreness. S/S of pneumothorax if dry needled over a lung field, and to seek immediate medical attention should they occur. Patient verbalized understanding of these instructions and education.  Patient Consent Given: Yes Education handout provided: Yes Muscles treated: Left lower lumbar multifidi and upper gluteal.   Treatment response/outcome: Twitch.   **Combo e'stim/US at 1.50 W/CM2 x 12 minutes to left LB f/b STW/M x 12 minutes to reduce tone f/b HMP and IFC at 80-150 Hz at 40% scan x 20 minutes in supine with wedge under knees for comfort.          PT Long Term Goals - 09/22/21 1602       PT LONG TERM GOAL #1   Title Patient will be independent with her HEP.    Time 4    Period Weeks     Status On-going    Target Date 09/22/21      PT LONG TERM GOAL #2   Title Patient will be able to complete her daily activities without her familair hip pain exceeding a 3/10.    Baseline pain is staying between a 1-2/10    Time 4    Period Weeks    Status Achieved    Target Date 09/22/21      PT LONG TERM GOAL #3   Title Patient will report being able to sleep at least 4 hours prior to waking up due to her left hip pain.    Baseline "I can't tell a big improvement yet." and it is a combination of low back and left hip pain    Time 4    Period Weeks    Status On-going    Target Date 09/22/21      PT LONG TERM GOAL #4   Title Patient will be able to navigate at least 4 steps in a reciprocal pattern without being limited by her left hip pain.    Time 4    Period Weeks    Status Achieved    Target Date 09/22/21      PT LONG TERM GOAL #5   Title Perform ADL's with LBP not exceeding 3-4/10.    Time 4    Period Weeks    Status New              Plan - 09/22/21 1617     Clinical Impression Statement Assessment to lumbar region today.  Great response to dry needling today.  Patient very pleased with overall progress.    Personal Factors and Comorbidities Time since onset of injury/illness/exacerbation;Comorbidity 2;Other    Comorbidities DM, HTN    Examination-Activity Limitations Locomotion Level;Stairs    Examination-Participation Restrictions Cleaning;Community Activity;Yard Work    Stability/Clinical Decision Making Stable/Uncomplicated    Rehab Potential Good    PT Frequency 2x / week    PT Treatment/Interventions Cryotherapy;Electrical Stimulation;Moist Heat;Neuromuscular re-education;Therapeutic exercise;Therapeutic activities;Functional mobility training;Stair training;Patient/family education;Manual techniques;Dry needling;Taping;Ultrasound;Traction    PT Next Visit Plan recumbent bike, sidelying hip abduction, hip ADD isometric, and modalities and dry needling as  needed.  Dry needling to lower lumbar region.               Perry Molla, [place], PT 09/22/2021, 4:23 PM

## 2021-09-28 ENCOUNTER — Encounter: Payer: Self-pay | Admitting: Physical Therapy

## 2021-09-28 ENCOUNTER — Ambulatory Visit: Payer: Medicare HMO | Attending: Orthopedic Surgery | Admitting: Physical Therapy

## 2021-09-28 DIAGNOSIS — M25552 Pain in left hip: Secondary | ICD-10-CM | POA: Insufficient documentation

## 2021-09-28 DIAGNOSIS — M5459 Other low back pain: Secondary | ICD-10-CM | POA: Insufficient documentation

## 2021-09-28 NOTE — Therapy (Addendum)
OUTPATIENT PHYSICAL THERAPY TREATMENT NOTE   Patient Name: Jacqueline Cox MRN: [id number] DOB:October 06, 1950, 71 y.o., female Today's Date: 09/28/2021   REFERRING PROVIDER: Rod Can MD   PT End of Session - 09/28/21 1146     Visit Number 10    Number of Visits 12    Date for PT Re-Evaluation 10/21/21    PT Start Time 1111    PT Stop Time 1208    PT Time Calculation (min) 57 min    Activity Tolerance Patient tolerated treatment well    Behavior During Therapy [facility] for tasks assessed/performed             Past Medical History:  Diagnosis Date   Anxiety    Atypical mole 05/20/2003   Right Foot, Dorsum (slight)   Atypical mole 07/20/2003   Right Upper Arm (Minimal)   Atypical mole 07/20/2003   Right Shin (Mild)   Atypical mole 09/24/2012   Right Ant. Thigh (Severe) (widershave)   Atypical mole 10/10/2012   Right Thigh (Mild)   BCC (basal cell carcinoma of skin) 09/24/2002   Upper Left Lip (MOH's)   Condyloma acuminata    COVID 07/2020   Diabetes mellitus without complication ([place]) 99/3570   type 2   Hypertension late 13s   Psoriasis age 46   hair line, pubic, hands   Superficial basal cell carcinoma (BCC) 06/20/2011   Right Side Nose ([place])   Superficial basal cell carcinoma (BCC) 06/02/2014   Right Side Nose   Past Surgical History:  Procedure Laterality Date   ABDOMINAL HYSTERECTOMY  1990   TAH, AUB, ? endometriosis, cervical dysplasia   APPENDECTOMY     done at the time of hysterectomy   [place]   benign BX   CERVICAL DISCECTOMY  1994   C 4-5   INCISION / [place] Right 2001   OPEN REPAIR PERIARTICULAR FRACTURE / DISLOCATION ELBOW Right 2000 for 3 surgeries   secondary to a fall   SHOULDER DEBRIDEMENT Left 07/2009   bone spur   TUBAL LIGATION  1986   Patient Active Problem List   Diagnosis Date Noted   Seasonal allergies 11/26/2018   Gastroesophageal reflux disease 07/29/2018   Diabetes mellitus, type 2  ([place]) 06/11/2012   HLD (hyperlipidemia) 06/11/2012   BP (high blood pressure) 06/11/2012   Hyperlipidemia associated with type 2 diabetes mellitus ([place]) 06/11/2012    REFERRING DIAG: Pain in left hip, LBP.  THERAPY DIAG:  Pain in left hip  Other low back pain  Rationale for Evaluation and Treatment Rehabilitation     SUBJECTIVE: Much better since last treatment.  Able to sleep on side now. PAIN:  Are you having pain? 2/10.  RT LB.     TODAY'S TREATMENT:  Patient prone over two pillows for comfort:  Trigger Point Dry-Needling  Treatment instructions: Expect mild to moderate muscle soreness. S/S of pneumothorax if dry needled over a lung field, and to seek immediate medical attention should they occur. Patient verbalized understanding of these instructions and education.  Patient Consent Given: Yes Education handout provided: Yes Muscles treated: Right lower lumbar multifidi and upper gluteal.   Treatment response/outcome: Twitch.  Nustep at level 3 x 10 minutes f/b STW/M to pts  RT LB x 13 minutes to reduce tone f/b HMP and IFC at 80-150 Hz at 40% scan x 20 minutes in supine with wedge under knees for comfort.          PT Long  Term Goals - 09/22/21 1602       PT LONG TERM GOAL #1   Title Patient will be independent with her HEP.    Time 4    Period Weeks    Status Achieved.   Target Date 09/22/21      PT LONG TERM GOAL #2   Title Patient will be able to complete her daily activities without her familair hip pain exceeding a 3/10.    Baseline pain is staying between a 1-2/10    Time 4    Period Weeks    Status Achieved    Target Date 09/22/21      PT LONG TERM GOAL #3   Title Patient will report being able to sleep at least 4 hours prior to waking up due to her left hip pain.    Baseline "I can't tell a big improvement yet." and it is a combination of low back and left hip pain    Time 4    Period Weeks    Status Partially Met.   Target Date 09/22/21       PT LONG TERM GOAL #4   Title Patient will be able to navigate at least 4 steps in a reciprocal pattern without being limited by her left hip pain.    Time 4    Period Weeks    Status Achieved    Target Date 09/22/21      PT LONG TERM GOAL #5   Title Perform ADL's with LBP not exceeding 3-4/10.    Time 4    Period Weeks    Status Partially met.             Plan - 09/28/21 1150     Clinical Impression Statement Great response to dry needling last session and this one.  Patient with a low pain-level and sleeping better.   Comorbidities DM, HTN    Examination-Activity Limitations Locomotion Level;Stairs    Examination-Participation Restrictions Cleaning;Community Activity;Yard Work    Stability/Clinical Decision Making Stable/Uncomplicated    Rehab Potential Good    PT Frequency 2x / week    PT Treatment/Interventions Cryotherapy;Electrical Stimulation;Moist Heat;Neuromuscular re-education;Therapeutic exercise;Therapeutic activities;Functional mobility training;Stair training;Patient/family education;Manual techniques;Dry needling;Taping;Ultrasound;Traction             Progress Note Reporting Period 08/25/21 to 09/28/21.  See note below for Objective Data and Assessment of Progress/Goals. Excellent progression toward goals.  Patient has found dry needling to be highly effective and she is now sleeping better.      Ardyn Forge, [place], PT 09/28/2021, 12:11 PM

## 2021-10-05 ENCOUNTER — Encounter: Payer: Medicare HMO | Admitting: Physical Therapy

## 2021-10-07 ENCOUNTER — Encounter: Payer: Self-pay | Admitting: Obstetrics and Gynecology

## 2021-10-07 ENCOUNTER — Ambulatory Visit (INDEPENDENT_AMBULATORY_CARE_PROVIDER_SITE_OTHER): Payer: Medicare HMO | Admitting: Obstetrics and Gynecology

## 2021-10-07 VITALS — BP 132/80 | HR 72 | Ht 64.0 in | Wt 148.0 lb

## 2021-10-07 DIAGNOSIS — Z87440 Personal history of urinary (tract) infections: Secondary | ICD-10-CM

## 2021-10-07 DIAGNOSIS — N763 Subacute and chronic vulvitis: Secondary | ICD-10-CM

## 2021-10-07 DIAGNOSIS — Z01419 Encounter for gynecological examination (general) (routine) without abnormal findings: Secondary | ICD-10-CM | POA: Diagnosis not present

## 2021-10-07 DIAGNOSIS — R32 Unspecified urinary incontinence: Secondary | ICD-10-CM

## 2021-10-07 DIAGNOSIS — N952 Postmenopausal atrophic vaginitis: Secondary | ICD-10-CM | POA: Diagnosis not present

## 2021-10-07 MED ORDER — ESTRADIOL 0.1 MG/GM VA CREA: TOPICAL_CREAM | VAGINAL | 1 refills | Status: AC

## 2021-10-07 MED ORDER — TRIAMCINOLONE ACETONIDE 0.025 % EX OINT: 1.0000 | TOPICAL_OINTMENT | Freq: Two times a day (BID) | CUTANEOUS | 0 refills | Status: AC

## 2021-10-07 NOTE — Progress Notes (Signed)
71 y.o. G42P2002 Married Caucasian female here for breast and pelvic exam.   Having UTIs.  2 UTIs last year in the spring and summer.  Then had one about a month ago.  No dysuria currently.   Recent treatment with Keflex.    Experiencing urgency to get to the bathroom at night.  Wondering if her bladder has shifted. Urgency is improved after treatment of UTI.   DF - every 3 hours depending on fluid intake.   NF - depends.  Can be 1 - 4 times per night depending on how her back is going.   May leak just a little bit with cough or sneeze.   Drinking water.  Caffeine coffee - 2 per day in am.  Rare soda.  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   Not sexually active right now.   No constipation, diarrhea, or accidental leakage of stool.   Has chronic periodic vulvar irritation.  Uses triamcinolone occasionally.   PCP:   Anastasia Pall, MD  Patient's last menstrual period was 07/25/1988.           Sexually active:oral not vaginal  The current method of family planning is tubal ligation and hysterectomy .    Exercising: Yes.    Gym/ health club routine includes cardio. Smoker:  no  Health Maintenance: Pap:  05/29/2006  History of abnormal Pap:  yes prior hysterectomy MMG:  06/2021 BI-RADS2 - dx bilateral mammogram and left breast US - [facility] Colonoscopy:  03/2020 normal BMD:   10/20/2019 Result  Osteopenia of hip and spine.   TDaP:  2012 Gardasil:   no HIV:NEG Hep C:NEG Screening Labs:  PCP   reports that she has never smoked. She has never used smokeless tobacco. She reports current alcohol use. She reports that she does not use drugs.  Past Medical History:  Diagnosis Date   Anxiety    Atypical mole 05/20/2003   Right Foot, Dorsum (slight)   Atypical mole 07/20/2003   Right Upper Arm (Minimal)   Atypical mole 07/20/2003   Right Shin (Mild)   Atypical mole 09/24/2012   Right Ant. Thigh (Severe) (widershave)   Atypical mole 10/10/2012   Right Thigh (Mild)    BCC (basal cell carcinoma of skin) 09/24/2002   Upper Left Lip (MOH's)   Condyloma acuminata    COVID 07/2020   Diabetes mellitus without complication ([place]) 19/5093   type 2   Hypertension late 102s   Psoriasis age 46   hair line, pubic, hands   Superficial basal cell carcinoma (BCC) 06/20/2011   Right Side Nose (Zyclara)   Superficial basal cell carcinoma (BCC) 06/02/2014   Right Side Nose    Past Surgical History:  Procedure Laterality Date   ABDOMINAL HYSTERECTOMY  1990   TAH, AUB, ? endometriosis, cervical dysplasia   APPENDECTOMY     done at the time of hysterectomy   BREAST SURGERY Left 1993 & 1995   benign BX   CERVICAL DISCECTOMY  1994   C 4-5   INCISION / DEBRIDEMENT BONE ELBOW Right 2001   OPEN REPAIR PERIARTICULAR FRACTURE / DISLOCATION ELBOW Right 2000 for 3 surgeries   secondary to a fall   SHOULDER DEBRIDEMENT Left 07/2009   bone spur   TUBAL LIGATION  1986    Current Outpatient Medications  Medication Sig Dispense Refill   albuterol (PROVENTIL HFA;VENTOLIN HFA) 108 (90 Base) MCG/ACT inhaler as needed.     amLODipine (NORVASC) 5 MG tablet Take 5 mg by mouth  daily.     atorvastatin (LIPITOR) 40 MG tablet Take 40 mg by mouth daily.     busPIRone (BUSPAR) 15 MG tablet      cetirizine (ZYRTEC) 10 MG tablet Take 10 mg by mouth daily.     cholecalciferol (VITAMIN D) 1000 UNITS tablet Take 2,000 Units by mouth daily.      lisinopril-hydrochlorothiazide (ZESTORETIC) 20-12.5 MG tablet Take 1 tablet by mouth daily.     Multiple Vitamin (MULTIVITAMIN) capsule Take 1 capsule by mouth daily.     omeprazole (PRILOSEC) 20 MG capsule Take 20 mg by mouth daily.     SitaGLIPtin-MetFORMIN HCl (JANUMET XR) 2487800309 MG TB24 Take by mouth.     triamcinolone (KENALOG) 0.025 % ointment Apply 1 application topically 2 (two) times daily. Use prn. 30 g 0   No current facility-administered medications for this visit.    Family History  Problem Relation Age of Onset   Hypertension  Mother    Brain cancer Mother 75   Hypertension Father    Diabetes Father    Diabetes Brother    Hypertension Brother    Lung cancer Brother 4       Lung cancer   Hypertension Sister    Hyperlipidemia Sister    Diabetes Sister    Hypertension Sister    Parkinson's disease Sister     Review of Systems  Genitourinary:        Bladder urine leakage    Exam:   BP 132/80 (BP Location: Right Arm, Patient Position: Sitting, Cuff Size: Normal)   Pulse 72   Ht '5\' 4"'$  (1.626 m)   Wt 148 lb (67.1 kg)   LMP 07/25/1988   BMI 25.40 kg/m     General appearance: alert, cooperative and appears stated age Head: normocephalic, without obvious abnormality, atraumatic Neck: no adenopathy, supple, symmetrical, trachea midline and thyroid normal to inspection and palpation Lungs: clear to auscultation bilaterally Breasts: right - normal appearance, no masses or tenderness, No nipple retraction or dimpling, No nipple discharge or bleeding, No axillary adenopathy Left - lateral scar noted.  No masses or tenderness, No nipple retraction or dimpling, No nipple discharge or bleeding, No axillary adenopathy Heart: regular rate and rhythm Abdomen: soft, non-tender; no masses, no organomegaly Extremities: extremities normal, atraumatic, no cyanosis or edema Skin: skin color, texture, turgor normal. No rashes or lesions Lymph nodes: cervical, supraclavicular, and axillary nodes normal. Neurologic: grossly normal  Pelvic: External genitalia:  no lesions              No abnormal inguinal nodes palpated.              Urethra:  normal appearing urethra with no masses, tenderness or lesions              Bartholins and Skenes: normal                 Vagina: normal appearing vagina with normal color and discharge, no lesions.  Generalized atrophy noted.               Cervix: absent              Pap taken: no Bimanual Exam:  Uterus: absent              Adnexa: no mass, fullness, tenderness               Rectal exam: yes.  Confirms.  Anus:  normal sphincter tone, no lesions  Chaperone was present for exam:  Kimalexis, CMA  Assessment:   Well woman visit with gynecologic exam. Status post TAH.  Ovaries remain. Menopausal atrophy.  UTIs.  Urinary incontinence.  Remote hx abnormal paps. Vaginal lesion in 2019.  Biopsies benign. Chronic vulvitis.  Status post left breast biopsy x 2.  Benign. Osteopenia.  PCP following.    Plan: Mammogram screening discussed. Self breast awareness reviewed. Pap and HR HPV as above. Guidelines for Calcium, Vitamin D, regular exercise program including cardiovascular and weight bearing exercise. Urinalysis and reflex culture.  Rx for vaginal estradiol cream.  1/2 gram to the vagina and pea size amount to the urethra twice weekly to treat atrophy and reduce risk of UTIs.  I did discuss potential effect on breast cancer.  Follow up annually and prn.   After visit summary provided.   36 min  total time was spent for this patient encounter, including preparation, face-to-face counseling with the patient, coordination of care, and documentation of the encounter.

## 2021-10-07 NOTE — Patient Instructions (Signed)

## 2021-10-09 LAB — CULTURE INDICATED

## 2021-10-09 LAB — URINE CULTURE
MICRO NUMBER:: 13648369
SPECIMEN QUALITY:: ADEQUATE

## 2021-10-09 LAB — URINALYSIS, COMPLETE W/RFL CULTURE
Bacteria, UA: NONE SEEN /HPF
Bilirubin Urine: NEGATIVE
Casts: NONE SEEN /LPF
Crystals: NONE SEEN /HPF
Glucose, UA: NEGATIVE
Hgb urine dipstick: NEGATIVE
Hyaline Cast: NONE SEEN /LPF
Ketones, ur: NEGATIVE
Nitrites, Initial: NEGATIVE
Protein, ur: NEGATIVE
RBC / HPF: NONE SEEN /HPF (ref 0–2)
Specific Gravity, Urine: 1.015 (ref 1.001–1.035)
Yeast: NONE SEEN /HPF
pH: 5.5 (ref 5.0–8.0)

## 2021-10-10 ENCOUNTER — Ambulatory Visit: Payer: Medicare HMO | Admitting: Physical Therapy

## 2021-10-10 ENCOUNTER — Encounter: Payer: Self-pay | Admitting: Physical Therapy

## 2021-10-10 DIAGNOSIS — M5459 Other low back pain: Secondary | ICD-10-CM

## 2021-10-10 DIAGNOSIS — M25552 Pain in left hip: Secondary | ICD-10-CM

## 2021-10-10 NOTE — Therapy (Signed)
OUTPATIENT PHYSICAL THERAPY TREATMENT NOTE   Patient Name: Jacqueline Cox MRN: [id number] DOB:[birth year], 71 y.o., female Today's Date: 10/10/2021   REFERRING PROVIDER: Rod Can MD   PT End of Session - 10/10/21 1355     Visit Number 11    Number of Visits 12    Date for PT Re-Evaluation 10/21/21    PT Start Time 1118    PT Stop Time 1217    PT Time Calculation (min) 59 min    Activity Tolerance Patient tolerated treatment well    Behavior During Therapy [facility] for tasks assessed/performed             Past Medical History:  Diagnosis Date   Anxiety    Atypical mole 05/20/2003   Right Foot, Dorsum (slight)   Atypical mole 07/20/2003   Right Upper Arm (Minimal)   Atypical mole 07/20/2003   Right Shin (Mild)   Atypical mole 09/24/2012   Right Ant. Thigh (Severe) (widershave)   Atypical mole 10/10/2012   Right Thigh (Mild)   BCC (basal cell carcinoma of skin) 09/24/2002   Upper Left Lip (MOH's)   Condyloma acuminata    COVID 07/2020   Diabetes mellitus without complication ([place]) 77/0340   type 2   Hypertension late 30s   Psoriasis age 53   hair line, pubic, hands   Superficial basal cell carcinoma (BCC) 06/20/2011   Right Side Nose (Zyclara)   Superficial basal cell carcinoma (BCC) 06/02/2014   Right Side Nose   Past Surgical History:  Procedure Laterality Date   ABDOMINAL HYSTERECTOMY  1990   TAH, AUB, ? endometriosis, cervical dysplasia   APPENDECTOMY     done at the time of hysterectomy   [place]   benign BX   CERVICAL DISCECTOMY  1994   C 4-5   INCISION / [place] Right 2001   OPEN REPAIR PERIARTICULAR FRACTURE / DISLOCATION ELBOW Right 2000 for 3 surgeries   secondary to a fall   SHOULDER DEBRIDEMENT Left 07/2009   bone spur   TUBAL LIGATION  1986   Patient Active Problem List   Diagnosis Date Noted   Seasonal allergies 11/26/2018   Gastroesophageal reflux disease 07/29/2018   Diabetes mellitus, type 2  ([place]) 06/11/2012   HLD (hyperlipidemia) 06/11/2012   BP (high blood pressure) 06/11/2012   Hyperlipidemia associated with type 2 diabetes mellitus ([place]) 06/11/2012    REFERRING DIAG: Pain in left hip, LBP.  THERAPY DIAG:  Pain in left hip  Other low back pain  Rationale for Evaluation and Treatment Rehabilitation     SUBJECTIVE: Been out of town and slept on different beds.  Made back hurt but doing okay today. PAIN:  Are you having pain? 3/10.  RT LB.     TODAY'S TREATMENT:  Patient prone over two pillows for comfort:  Trigger Point Dry-Needling  Treatment instructions: Expect mild to moderate muscle soreness. S/S of pneumothorax if dry needled over a lung field, and to seek immediate medical attention should they occur. Patient verbalized understanding of these instructions and education.  Patient Consent Given: Yes Education handout provided: Yes Muscles treated: Bilateral lower lumbar multifidi and upper gluteal.   Treatment response/outcome: Twitch.  Combo e'stim/US at 1.50 W/CM2 x 12 minutes to bilateral lower lumbar region f/b Bilateral LB x 12 minutes to reduce tone f/b HMP and IFC at 80-150 Hz at 40% scan x 20 minutes in supine with wedge under knees for comfort.  Clinical impression.  Very good response to dry needling.  Patient very pleased with progress.  Pain increased some due to sleeping on different beds but she felt very good after treatment today.     PT Long Term Goals - 09/22/21 1602       PT LONG TERM GOAL #1   Title Patient will be independent with her HEP.    Time 4    Period Weeks    Status Achieved.   Target Date 09/22/21      PT LONG TERM GOAL #2   Title Patient will be able to complete her daily activities without her familair hip pain exceeding a 3/10.    Baseline pain is staying between a 1-2/10    Time 4    Period Weeks    Status Achieved    Target Date 09/22/21      PT LONG TERM GOAL #3   Title Patient will report being  able to sleep at least 4 hours prior to waking up due to her left hip pain.    Baseline "I can't tell a big improvement yet." and it is a combination of low back and left hip pain    Time 4    Period Weeks    Status Partially Met.   Target Date 09/22/21      PT LONG TERM GOAL #4   Title Patient will be able to navigate at least 4 steps in a reciprocal pattern without being limited by her left hip pain.    Time 4    Period Weeks    Status Achieved    Target Date 09/22/21      PT LONG TERM GOAL #5   Title Perform ADL's with LBP not exceeding 3-4/10.    Time 4    Period Weeks    Status Partially met.             Plan - 09/28/21 1150     Clinical Impression Statement Great response to dry needling last session and this one.  Patient with a low pain-level and sleeping better.   Comorbidities DM, HTN    Examination-Activity Limitations Locomotion Level;Stairs    Examination-Participation Restrictions Cleaning;Community Activity;Yard Work    Stability/Clinical Decision Making Stable/Uncomplicated    Rehab Potential Good    PT Frequency 2x / week    PT Treatment/Interventions Cryotherapy;Electrical Stimulation;Moist Heat;Neuromuscular re-education;Therapeutic exercise;Therapeutic activities;Functional mobility training;Stair training;Patient/family education;Manual techniques;Dry needling;Taping;Ultrasound;Traction             Progress Note  Nichollas Perusse, [place], PT 10/10/2021, 1:56 PM

## 2021-10-13 ENCOUNTER — Ambulatory Visit: Payer: Medicare HMO | Admitting: Physical Therapy

## 2021-10-13 ENCOUNTER — Encounter: Payer: Self-pay | Admitting: Physical Therapy

## 2021-10-13 DIAGNOSIS — M5459 Other low back pain: Secondary | ICD-10-CM

## 2021-10-13 DIAGNOSIS — M25552 Pain in left hip: Secondary | ICD-10-CM | POA: Diagnosis not present

## 2021-10-13 NOTE — Therapy (Addendum)
OUTPATIENT PHYSICAL THERAPY TREATMENT NOTE   Patient Name: Jacqueline Cox MRN: [id number] DOB:11-13-1950, 71 y.o., female Today's Date: 10/13/2021   REFERRING PROVIDER: Rod Can MD   PT End of Session - 10/13/21 1609     Visit Number 12    Number of Visits 18    Date for PT Re-Evaluation 11/11/21    PT Start Time 0316    PT Stop Time 0413    PT Time Calculation (min) 57 min    Activity Tolerance Patient tolerated treatment well    Behavior During Therapy [facility] for tasks assessed/performed             Past Medical History:  Diagnosis Date   Anxiety    Atypical mole 05/20/2003   Right Foot, Dorsum (slight)   Atypical mole 07/20/2003   Right Upper Arm (Minimal)   Atypical mole 07/20/2003   Right Shin (Mild)   Atypical mole 09/24/2012   Right Ant. Thigh (Severe) (widershave)   Atypical mole 10/10/2012   Right Thigh (Mild)   BCC (basal cell carcinoma of skin) 09/24/2002   Upper Left Lip (MOH's)   Condyloma acuminata    COVID 07/2020   Diabetes mellitus without complication ([place]) 85/0277   type 2   Hypertension late 16s   Psoriasis age 44   hair line, pubic, hands   Superficial basal cell carcinoma (BCC) 06/20/2011   Right Side Nose (Zyclara)   Superficial basal cell carcinoma (BCC) 06/02/2014   Right Side Nose   Past Surgical History:  Procedure Laterality Date   ABDOMINAL HYSTERECTOMY  1990   TAH, AUB, ? endometriosis, cervical dysplasia   APPENDECTOMY     done at the time of hysterectomy   [place]   benign BX   CERVICAL DISCECTOMY  1994   C 4-5   INCISION / [place] Right 2001   OPEN REPAIR PERIARTICULAR FRACTURE / DISLOCATION ELBOW Right 2000 for 3 surgeries   secondary to a fall   SHOULDER DEBRIDEMENT Left 07/2009   bone spur   TUBAL LIGATION  1986   Patient Active Problem List   Diagnosis Date Noted   Seasonal allergies 11/26/2018   Gastroesophageal reflux disease 07/29/2018   Diabetes mellitus, type 2  ([place]) 06/11/2012   HLD (hyperlipidemia) 06/11/2012   BP (high blood pressure) 06/11/2012   Hyperlipidemia associated with type 2 diabetes mellitus ([place]) 06/11/2012    REFERRING DIAG: Pain in left hip, LBP.  THERAPY DIAG:  Pain in left hip  Other low back pain  Rationale for Evaluation and Treatment Rehabilitation     SUBJECTIVE: Been out of town and slept on different beds.  Made back hurt but doing okay today. PAIN:  Are you having pain? 2/10.  LT LB.     TODAY'S TREATMENT:  Patient prone over two pillows for comfort:  Trigger Point Dry-Needling  Treatment instructions: Expect mild to moderate muscle soreness. S/S of pneumothorax if dry needled over a lung field, and to seek immediate medical attention should they occur. Patient verbalized understanding of these instructions and education.  Patient Consent Given: Yes Education handout provided: Yes Muscles treated: Bilateral lower lumbar multifidi. Treatment response/outcome: Twitch.  Combo e'stim/US at 1.50 W/CM2 x 12 minutes to bilateral lower lumbar region f/b STW/M to LB x 12 minutes to reduce tone f/b HMP and IFC at 80-150 Hz at 40% scan x 15 minutes in supine with wedge under knees for comfort.     Clinical impression.  Pain  lower today upon presentation to the clinic today.  Another very good response to treatment.  She would like to continue at this time and add treatment to her right hip.   PT Long Term Goals - 09/22/21 1602       PT LONG TERM GOAL #1   Title Patient will be independent with her HEP.    Time 4    Period Weeks    Status Achieved.   Target Date 09/22/21      PT LONG TERM GOAL #2   Title Patient will be able to complete her daily activities without her familair hip pain exceeding a 3/10.    Baseline pain is staying between a 1-2/10    Time 4    Period Weeks    Status Achieved    Target Date 09/22/21      PT LONG TERM GOAL #3   Title Patient will report being able to sleep at least 4  hours prior to waking up due to her left hip pain.    Baseline "I can't tell a big improvement yet." and it is a combination of low back and left hip pain    Time 4    Period Weeks    Status Partially Met.   Target Date 09/22/21      PT LONG TERM GOAL #4   Title Patient will be able to navigate at least 4 steps in a reciprocal pattern without being limited by her left hip pain.    Time 4    Period Weeks    Status Achieved    Target Date 09/22/21      PT LONG TERM GOAL #5   Title Perform ADL's with LBP not exceeding 3-4/10.    Time 4    Period Weeks    Status Partially met.             Plan - 09/28/21 1150     Clinical Impression Statement Great response to dry needling last session and this one.  Patient with a low pain-level and sleeping better.   Comorbidities DM, HTN    Examination-Activity Limitations Locomotion Level;Stairs    Examination-Participation Restrictions Cleaning;Community Activity;Yard Work    Stability/Clinical Decision Making Stable/Uncomplicated    Rehab Potential Good    PT Frequency 2x / week    PT Treatment/Interventions Cryotherapy;Electrical Stimulation;Moist Heat;Neuromuscular re-education;Therapeutic exercise;Therapeutic activities;Functional mobility training;Stair training;Patient/family education;Manual techniques;Dry needling;Taping;Ultrasound;Traction             Progress Note  Lollie Gunner, [place], PT 10/13/2021, 4:18 PM

## 2021-10-17 ENCOUNTER — Ambulatory Visit: Payer: Medicare HMO | Admitting: Physical Therapy

## 2021-10-17 ENCOUNTER — Encounter: Payer: Self-pay | Admitting: Physical Therapy

## 2021-10-17 DIAGNOSIS — M25552 Pain in left hip: Secondary | ICD-10-CM | POA: Diagnosis not present

## 2021-10-17 DIAGNOSIS — M5459 Other low back pain: Secondary | ICD-10-CM

## 2021-10-17 NOTE — Therapy (Signed)
OUTPATIENT PHYSICAL THERAPY TREATMENT NOTE   Patient Name: Jacqueline Cox MRN: [id number] DOB:1950/06/14, 71 y.o., female Today's Date: 10/17/2021   REFERRING PROVIDER: Rod Can MD   PT End of Session - 10/17/21 1534     Visit Number 13    Number of Visits 18    Date for PT Re-Evaluation 11/11/21    PT Start Time 0230    PT Stop Time 0327    PT Time Calculation (min) 57 min    Activity Tolerance Patient tolerated treatment well    Behavior During Therapy [facility] for tasks assessed/performed             Past Medical History:  Diagnosis Date   Anxiety    Atypical mole 05/20/2003   Right Foot, Dorsum (slight)   Atypical mole 07/20/2003   Right Upper Arm (Minimal)   Atypical mole 07/20/2003   Right Shin (Mild)   Atypical mole 09/24/2012   Right Ant. Thigh (Severe) (widershave)   Atypical mole 10/10/2012   Right Thigh (Mild)   BCC (basal cell carcinoma of skin) 09/24/2002   Upper Left Lip (MOH's)   Condyloma acuminata    COVID 07/2020   Diabetes mellitus without complication ([place]) 07/3974   type 2   Hypertension late 48s   Psoriasis age 72   hair line, pubic, hands   Superficial basal cell carcinoma (BCC) 06/20/2011   Right Side Nose (Zyclara)   Superficial basal cell carcinoma (BCC) 06/02/2014   Right Side Nose   Past Surgical History:  Procedure Laterality Date   ABDOMINAL HYSTERECTOMY  1990   TAH, AUB, ? endometriosis, cervical dysplasia   APPENDECTOMY     done at the time of hysterectomy   [place]   benign BX   CERVICAL DISCECTOMY  1994   C 4-5   INCISION / [place] Right 2001   OPEN REPAIR PERIARTICULAR FRACTURE / DISLOCATION ELBOW Right 2000 for 3 surgeries   secondary to a fall   SHOULDER DEBRIDEMENT Left 07/2009   bone spur   TUBAL LIGATION  1986   Patient Active Problem List   Diagnosis Date Noted   Seasonal allergies 11/26/2018   Gastroesophageal reflux disease 07/29/2018   Diabetes mellitus, type 2  ([place]) 06/11/2012   HLD (hyperlipidemia) 06/11/2012   BP (high blood pressure) 06/11/2012   Hyperlipidemia associated with type 2 diabetes mellitus ([place]) 06/11/2012    REFERRING DIAG: Pain in left hip, LBP.  THERAPY DIAG:  Pain in left hip  Other low back pain  Rationale for Evaluation and Treatment Rehabilitation     SUBJECTIVE: Feel at least 75% better overall.Marland Kitchen PAIN:  Are you having pain? 2/10.  LT LB.     TODAY'S TREATMENT:                                      EXERCISE LOG  Exercise Repetitions and Resistance Comments  Nustep level 4 10 minutes.    Back extension machine with 60# 4 minutes.                Blank cell = exercise not performed today  Patient prone over two pillows for comfort:  Trigger Point Dry-Needling  Treatment instructions: Expect mild to moderate muscle soreness. S/S of pneumothorax if dry needled over a lung field, and to seek immediate medical attention should they occur. Patient verbalized understanding of these instructions and  education.  Patient Consent Given: Yes Education handout provided: Yes Muscles treated: Left TFL and glut med. Treatment response/outcome: Twitch.   STW/M x 10 minutes to patient's left lateral hip musculature and left QL with ischemic release technique ultilized.  HMP and IFC at 80-150 Hz at 40% scan x 20 minutes to patient's left low back and lateral hip.      Clinical impression.  Overall, patient is doing much better.  Sleeping better and longer.  PT Long Term Goals - 09/22/21 1602       PT LONG TERM GOAL #1   Title Patient will be independent with her HEP.    Time 4    Period Weeks    Status Achieved.   Target Date 09/22/21      PT LONG TERM GOAL #2   Title Patient will be able to complete her daily activities without her familair hip pain exceeding a 3/10.    Baseline pain is staying between a 1-2/10    Time 4    Period Weeks    Status Achieved    Target Date 09/22/21      PT LONG TERM  GOAL #3   Title Patient will report being able to sleep at least 4 hours prior to waking up due to her left hip pain.    Baseline "I can't tell a big improvement yet." and it is a combination of low back and left hip pain    Time 4    Period Weeks    Status Partially Met.   Target Date 09/22/21      PT LONG TERM GOAL #4   Title Patient will be able to navigate at least 4 steps in a reciprocal pattern without being limited by her left hip pain.    Time 4    Period Weeks    Status Achieved    Target Date 09/22/21      PT LONG TERM GOAL #5   Title Perform ADL's with LBP not exceeding 3-4/10.    Time 4    Period Weeks    Status Partially met.             Plan - 09/28/21 1150     Clinical Impression Statement Great response to dry needling last session and this one.  Patient with a low pain-level and sleeping better.   Comorbidities DM, HTN    Examination-Activity Limitations Locomotion Level;Stairs    Examination-Participation Restrictions Cleaning;Community Activity;Yard Work    Stability/Clinical Decision Making Stable/Uncomplicated    Rehab Potential Good    PT Frequency 2x / week    PT Treatment/Interventions Cryotherapy;Electrical Stimulation;Moist Heat;Neuromuscular re-education;Therapeutic exercise;Therapeutic activities;Functional mobility training;Stair training;Patient/family education;Manual techniques;Dry needling;Taping;Ultrasound;Traction             Calia Napp, [place], PT 10/17/2021, 3:35 PM

## 2021-10-20 ENCOUNTER — Ambulatory Visit: Payer: Medicare HMO | Admitting: Physical Therapy

## 2021-10-20 DIAGNOSIS — M25552 Pain in left hip: Secondary | ICD-10-CM

## 2021-10-20 DIAGNOSIS — M5459 Other low back pain: Secondary | ICD-10-CM

## 2021-10-20 NOTE — Therapy (Signed)
OUTPATIENT PHYSICAL THERAPY TREATMENT NOTE   Patient Name: Jacqueline Cox MRN: [id number] DOB:02/20/1951, 71 y.o., female Today's Date: 10/20/2021   REFERRING PROVIDER: Brian Swinteck MD   PT End of Session - 10/20/21 1602     Visit Number 14    Number of Visits 18    Date for PT Re-Evaluation 11/11/21    PT Start Time 0315    Activity Tolerance Patient tolerated treatment well    Behavior During Therapy WFL for tasks assessed/performed             Past Medical History:  Diagnosis Date   Anxiety    Atypical mole 05/20/2003   Right Foot, Dorsum (slight)   Atypical mole 07/20/2003   Right Upper Arm (Minimal)   Atypical mole 07/20/2003   Right Shin (Mild)   Atypical mole 09/24/2012   Right Ant. Thigh (Severe) (widershave)   Atypical mole 10/10/2012   Right Thigh (Mild)   BCC (basal cell carcinoma of skin) 09/24/2002   Upper Left Lip (MOH's)   Condyloma acuminata    COVID 07/2020   Diabetes mellitus without complication (HCC) 07/2011   type 2   Hypertension late 40s   Psoriasis age 36   hair line, pubic, hands   Superficial basal cell carcinoma (BCC) 06/20/2011   Right Side Nose (Zyclara)   Superficial basal cell carcinoma (BCC) 06/02/2014   Right Side Nose   Past Surgical History:  Procedure Laterality Date   ABDOMINAL HYSTERECTOMY  1990   TAH, AUB, ? endometriosis, cervical dysplasia   APPENDECTOMY     done at the time of hysterectomy   BREAST SURGERY Left 1993 & 1995   benign BX   CERVICAL DISCECTOMY  1994   C 4-5   INCISION / DEBRIDEMENT BONE ELBOW Right 2001   OPEN REPAIR PERIARTICULAR FRACTURE / DISLOCATION ELBOW Right 2000 for 3 surgeries   secondary to a fall   SHOULDER DEBRIDEMENT Left 07/2009   bone spur   TUBAL LIGATION  1986   Patient Active Problem List   Diagnosis Date Noted   Seasonal allergies 11/26/2018   Gastroesophageal reflux disease 07/29/2018   Diabetes mellitus, type 2 (HCC) 06/11/2012   HLD (hyperlipidemia) 06/11/2012   BP  (high blood pressure) 06/11/2012   Hyperlipidemia associated with type 2 diabetes mellitus (HCC) 06/11/2012    REFERRING DIAG: Pain in left hip, LBP.   THERAPY DIAG:  No diagnosis found.  Rationale for Evaluation and Treatment Rehabilitation     SUBJECTIVE: Feel at least 75% better overall.. PAIN:  Are you having pain? 2/10.  LB/Hips.    TODAY'S TREATMENT:     Blank cell = exercise not performed today  Patient prone over two pillows for comfort:  Trigger Point Dry-Needling  Treatment instructions: Expect mild to moderate muscle soreness. S/S of pneumothorax if dry needled over a lung field, and to seek immediate medical attention should they occur. Patient verbalized understanding of these instructions and education.  Patient Consent Given: Yes Education handout provided: Yes Muscles treated: Right TFL and glut med. Treatment response/outcome: Twitch.   Combo e'stim/US at 1.50 W/CM2 x 12 minutes to patient's left lateral hip musculature.  STW/M x 12 minutes to patient's left lateral hip musculature and left QL with ischemic release technique ultilized.  HMP and IFC at 80-150 Hz at 40% scan x 20 minutes to patient's right  low back and right lateral hip.      Clinical impression.  Referral right hip:  Pain ~2/10.  Full right   hip range of motion and strength. Some tenderness with increased tone over right TFL and to a lesser degrees over her glut med.  Patient did very well with treatment today.  She states she is able to sleep on both sides now.  PT Long Term Goals - 09/22/21 1602       PT LONG TERM GOAL #1   Title Patient will be independent with her HEP.    Time 4    Period Weeks    Status Achieved.   Target Date 09/22/21      PT LONG TERM GOAL #2   Title Patient will be able to complete her daily activities without her familair hip pain exceeding a 3/10.    Baseline pain is staying between a 1-2/10    Time 4    Period Weeks    Status Achieved    Target  Date 09/22/21      PT LONG TERM GOAL #3   Title Patient will report being able to sleep at least 4 hours prior to waking up due to her left hip pain.    Baseline "I can't tell a big improvement yet." and it is a combination of low back and left hip pain    Time 4    Period Weeks    Status Partially Met.   Target Date 09/22/21      PT LONG TERM GOAL #4   Title Patient will be able to navigate at least 4 steps in a reciprocal pattern without being limited by her left hip pain.    Time 4    Period Weeks    Status Achieved    Target Date 09/22/21      PT LONG TERM GOAL #5   Title Perform ADL's with LBP not exceeding 3-4/10.    Time 4    Period Weeks    Status Partially met.             Plan - 09/28/21 1150     Clinical Impression Statement Great response to dry needling last session and this one.  Patient with a low pain-level and sleeping better.   Comorbidities DM, HTN    Examination-Activity Limitations Locomotion Level;Stairs    Examination-Participation Restrictions Cleaning;Community Activity;Yard Work    Stability/Clinical Decision Making Stable/Uncomplicated    Rehab Potential Good    PT Frequency 2x / week    PT Treatment/Interventions Cryotherapy;Electrical Stimulation;Moist Heat;Neuromuscular re-education;Therapeutic exercise;Therapeutic activities;Functional mobility training;Stair training;Patient/family education;Manual techniques;Dry needling;Taping;Ultrasound;Traction             Lilianna Case, [place], PT 10/20/2021, 4:05 PM

## 2021-10-24 ENCOUNTER — Encounter: Payer: Self-pay | Admitting: Physical Therapy

## 2021-10-24 ENCOUNTER — Ambulatory Visit: Payer: Medicare HMO | Admitting: Physical Therapy

## 2021-10-24 DIAGNOSIS — M5459 Other low back pain: Secondary | ICD-10-CM

## 2021-10-24 DIAGNOSIS — M25552 Pain in left hip: Secondary | ICD-10-CM

## 2021-10-24 NOTE — Therapy (Signed)
OUTPATIENT PHYSICAL THERAPY TREATMENT NOTE   Patient Name: Jacqueline Cox MRN: [id number] DOB:1950-05-18, 71 y.o., female Today's Date: 10/24/2021   REFERRING PROVIDER: Rod Can MD   PT End of Session - 10/24/21 1401     Visit Number 15    Number of Visits 18    Date for PT Re-Evaluation 11/11/21    PT Start Time 0100    PT Stop Time 0157    PT Time Calculation (min) 57 min    Activity Tolerance Patient tolerated treatment well    Behavior During Therapy [facility] for tasks assessed/performed             Past Medical History:  Diagnosis Date   Anxiety    Atypical mole 05/20/2003   Right Foot, Dorsum (slight)   Atypical mole 07/20/2003   Right Upper Arm (Minimal)   Atypical mole 07/20/2003   Right Shin (Mild)   Atypical mole 09/24/2012   Right Ant. Thigh (Severe) (widershave)   Atypical mole 10/10/2012   Right Thigh (Mild)   BCC (basal cell carcinoma of skin) 09/24/2002   Upper Left Lip (MOH's)   Condyloma acuminata    COVID 07/2020   Diabetes mellitus without complication ([place]) 70/6237   type 2   Hypertension late 67s   Psoriasis age 18   hair line, pubic, hands   Superficial basal cell carcinoma (BCC) 06/20/2011   Right Side Nose (Zyclara)   Superficial basal cell carcinoma (BCC) 06/02/2014   Right Side Nose   Past Surgical History:  Procedure Laterality Date   ABDOMINAL HYSTERECTOMY  1990   TAH, AUB, ? endometriosis, cervical dysplasia   APPENDECTOMY     done at the time of hysterectomy   [place]   benign BX   CERVICAL DISCECTOMY  1994   C 4-5   INCISION / [place] Right 2001   OPEN REPAIR PERIARTICULAR FRACTURE / DISLOCATION ELBOW Right 2000 for 3 surgeries   secondary to a fall   SHOULDER DEBRIDEMENT Left 07/2009   bone spur   TUBAL LIGATION  1986   Patient Active Problem List   Diagnosis Date Noted   Seasonal allergies 11/26/2018   Gastroesophageal reflux disease 07/29/2018   Diabetes mellitus, type 2  ([place]) 06/11/2012   HLD (hyperlipidemia) 06/11/2012   BP (high blood pressure) 06/11/2012   Hyperlipidemia associated with type 2 diabetes mellitus ([place]) 06/11/2012    REFERRING DIAG: Pain in left hip, LBP.   THERAPY DIAG:  Pain in left hip  Other low back pain  Rationale for Evaluation and Treatment Rehabilitation     SUBJECTIVE: Pleased with progress. PAIN:  Are you having pain? 2/10.  LB/Hips.    TODAY'S TREATMENT:     Blank cell = exercise not performed today  Patient prone over two pillows for comfort:  Trigger Point Dry-Needling  Treatment instructions: Expect mild to moderate muscle soreness. S/S of pneumothorax if dry needled over a lung field, and to seek immediate medical attention should they occur. Patient verbalized understanding of these instructions and education.  Patient Consent Given: Yes Education handout provided: Yes Muscles treated: Bil lumbar multifidi Treatment response/outcome: Twitch.   Combo e'stim/US at 1.50 W/CM2 x 12 minutes to patient's left lateral hip musculature.  STW/M x 12 minutes to patient's bil lower lumbar musculature.  HMP and IFC at 80-150 Hz at 40% scan x 20 minutes to patient's bilateral LB region.     Clinical impression. Pain in low back about 2/10.  She did great with treatment including dry needling today. Essentially pain-free after treatment today.  PT Long Term Goals - 09/22/21 1602       PT LONG TERM GOAL #1   Title Patient will be independent with her HEP.    Time 4    Period Weeks    Status Achieved.   Target Date 09/22/21      PT LONG TERM GOAL #2   Title Patient will be able to complete her daily activities without her familair hip pain exceeding a 3/10.    Baseline pain is staying between a 1-2/10    Time 4    Period Weeks    Status Achieved    Target Date 09/22/21      PT LONG TERM GOAL #3   Title Patient will report being able to sleep at least 4 hours prior to waking up due to her left hip  pain.    Baseline "I can't tell a big improvement yet." and it is a combination of low back and left hip pain    Time 4    Period Weeks    Status Partially Met.   Target Date 09/22/21      PT LONG TERM GOAL #4   Title Patient will be able to navigate at least 4 steps in a reciprocal pattern without being limited by her left hip pain.    Time 4    Period Weeks    Status Achieved    Target Date 09/22/21      PT LONG TERM GOAL #5   Title Perform ADL's with LBP not exceeding 3-4/10.    Time 4    Period Weeks    Status Partially met.             Plan - 09/28/21 1150     Clinical Impression Statement Great response to dry needling last session and this one.  Patient with a low pain-level and sleeping better.   Comorbidities DM, HTN    Examination-Activity Limitations Locomotion Level;Stairs    Examination-Participation Restrictions Cleaning;Community Activity;Yard Work    Stability/Clinical Decision Making Stable/Uncomplicated    Rehab Potential Good    PT Frequency 2x / week    PT Treatment/Interventions Cryotherapy;Electrical Stimulation;Moist Heat;Neuromuscular re-education;Therapeutic exercise;Therapeutic activities;Functional mobility training;Stair training;Patient/family education;Manual techniques;Dry needling;Taping;Ultrasound;Traction             Alice Burnside, [place], PT 10/24/2021, 2:04 PM

## 2021-10-27 ENCOUNTER — Ambulatory Visit: Payer: Medicare HMO | Attending: Orthopedic Surgery | Admitting: Physical Therapy

## 2021-10-27 ENCOUNTER — Encounter: Payer: Self-pay | Admitting: Physical Therapy

## 2021-10-27 DIAGNOSIS — M25552 Pain in left hip: Secondary | ICD-10-CM | POA: Insufficient documentation

## 2021-10-27 DIAGNOSIS — M5459 Other low back pain: Secondary | ICD-10-CM | POA: Diagnosis present

## 2021-10-27 NOTE — Therapy (Signed)
OUTPATIENT PHYSICAL THERAPY TREATMENT NOTE   Patient Name: Jacqueline Cox MRN: [id number] DOB:[birth year], 71 y.o., female 26 Date: 10/27/2021   REFERRING PROVIDER: Rod Can MD   PT End of Session - 10/27/21 1520     Visit Number 16    Number of Visits 18    Date for PT Re-Evaluation 11/11/21    PT Start Time 0316    PT Stop Time 0411    PT Time Calculation (min) 55 min    Activity Tolerance Patient tolerated treatment well    Behavior During Therapy [facility] for tasks assessed/performed             Past Medical History:  Diagnosis Date   Anxiety    Atypical mole 05/20/2003   Right Foot, Dorsum (slight)   Atypical mole 07/20/2003   Right Upper Arm (Minimal)   Atypical mole 07/20/2003   Right Shin (Mild)   Atypical mole 09/24/2012   Right Ant. Thigh (Severe) (widershave)   Atypical mole 10/10/2012   Right Thigh (Mild)   BCC (basal cell carcinoma of skin) 09/24/2002   Upper Left Lip (MOH's)   Condyloma acuminata    COVID 07/2020   Diabetes mellitus without complication ([place]) 02/7516   type 2   Hypertension late 56s   Psoriasis age 76   hair line, pubic, hands   Superficial basal cell carcinoma (BCC) 06/20/2011   Right Side Nose (Zyclara)   Superficial basal cell carcinoma (BCC) 06/02/2014   Right Side Nose   Past Surgical History:  Procedure Laterality Date   ABDOMINAL HYSTERECTOMY  1990   TAH, AUB, ? endometriosis, cervical dysplasia   APPENDECTOMY     done at the time of hysterectomy   [place]   benign BX   CERVICAL DISCECTOMY  1994   C 4-5   INCISION / [place] Right 2001   OPEN REPAIR PERIARTICULAR FRACTURE / DISLOCATION ELBOW Right 2000 for 3 surgeries   secondary to a fall   SHOULDER DEBRIDEMENT Left 07/2009   bone spur   TUBAL LIGATION  1986   Patient Active Problem List   Diagnosis Date Noted   Seasonal allergies 11/26/2018   Gastroesophageal reflux disease 07/29/2018   Diabetes mellitus, type 2  ([place]) 06/11/2012   HLD (hyperlipidemia) 06/11/2012   BP (high blood pressure) 06/11/2012   Hyperlipidemia associated with type 2 diabetes mellitus ([place]) 06/11/2012    REFERRING DIAG: Pain in left hip, LBP.   THERAPY DIAG:  Pain in left hip  Other low back pain  Rationale for Evaluation and Treatment Rehabilitation     SUBJECTIVE: Pleased with progress. PAIN:  Are you having pain? 3/10.  Traveled and slept in another bed the other night.    TODAY'S TREATMENT:     Blank cell = exercise not performed today  Patient prone over two pillows for comfort:  Trigger Point Dry-Needling  Treatment instructions: Expect mild to moderate muscle soreness. S/S of pneumothorax if dry needled over a lung field, and to seek immediate medical attention should they occur. Patient verbalized understanding of these instructions and education.  Patient Consent Given: Yes Education handout provided: Yes Muscles treated: Bil lumbar multifidi Treatment response/outcome: Twitch.  Nustep, level 4 x 11 minutes. STW/M x 12 minutes to patient's bil lower lumbar musculature.  HMP and IFC at 80-150 Hz at 40% scan x 20 minutes to patient's bilateral LB region. [place] by [place] Baylynn Shifflett Aug 3rd, 2023 View at [email or web address] using code:  [email or web address] Total 3 Page 1 of 1 SINGLE KNEE TO CHEST STRETCH - SKTC While Lying on your back, hold your knee and gently pull it up towards your chest. Repeat 3 Times Hold 30 Seconds Complete 1 Set Perform 3 Times a Day Double Knee to chest stretch Pull your knees up to your chest hold for 30 seconds then relax. Repeat Continue to breathe throughout stretch. Repeat 2 Times Hold 30 Seconds Complete 3 Sets Perform 2 Times a Day [place] While laying on your back, contract the low abdominals and lift from the hips. Repeat 15 Times Hold 2 Seconds Complete 2 Sets Perform 2 Times a Day    Clinical impression. Overall, much improved.   Instruct in HEP.  Picture provided.  PT Long Term Goals - 09/22/21 1602       PT LONG TERM GOAL #1   Title Patient will be independent with her HEP.    Time 4    Period Weeks    Status Achieved.   Target Date 09/22/21      PT LONG TERM GOAL #2   Title Patient will be able to complete her daily activities without her familair hip pain exceeding a 3/10.    Baseline pain is staying between a 1-2/10    Time 4    Period Weeks    Status Achieved    Target Date 09/22/21      PT LONG TERM GOAL #3   Title Patient will report being able to sleep at least 4 hours prior to waking up due to her left hip pain.    Baseline "I can't tell a big improvement yet." and it is a combination of low back and left hip pain    Time 4    Period Weeks    Status Partially Met.   Target Date 09/22/21      PT LONG TERM GOAL #4   Title Patient will be able to navigate at least 4 steps in a reciprocal pattern without being limited by her left hip pain.    Time 4    Period Weeks    Status Achieved    Target Date 09/22/21      PT LONG TERM GOAL #5   Title Perform ADL's with LBP not exceeding 3-4/10.    Time 4    Period Weeks    Status Partially met.             Plan - 09/28/21 1150     Clinical Impression Statement Overall much improvement.  Instruct in HEP today.   Comorbidities DM, HTN    Examination-Activity Limitations Locomotion Level;Stairs    Examination-Participation Restrictions Cleaning;Community Activity;Yard Work    Stability/Clinical Decision Making Stable/Uncomplicated    Rehab Potential Good    PT Frequency 2x / week    PT Treatment/Interventions Cryotherapy;Electrical Stimulation;Moist Heat;Neuromuscular re-education;Therapeutic exercise;Therapeutic activities;Functional mobility training;Stair training;Patient/family education;Manual techniques;Dry needling;Taping;Ultrasound;Traction             Anastassia Noack, [place], PT 10/27/2021, 4:40 PM

## 2021-10-31 ENCOUNTER — Encounter: Payer: Self-pay | Admitting: Physical Therapy

## 2021-10-31 ENCOUNTER — Ambulatory Visit: Payer: Medicare HMO | Admitting: Physical Therapy

## 2021-10-31 DIAGNOSIS — M25552 Pain in left hip: Secondary | ICD-10-CM

## 2021-10-31 DIAGNOSIS — M5459 Other low back pain: Secondary | ICD-10-CM

## 2021-10-31 NOTE — Therapy (Signed)
OUTPATIENT PHYSICAL THERAPY TREATMENT NOTE   Patient Name: Jacqueline Cox MRN: [id number] DOB:1950-10-09, 71 y.o., female Today's Date: 10/31/2021   REFERRING PROVIDER: Rod Can MD   PT End of Session - 10/31/21 1425     Visit Number 17    Number of Visits 18    Date for PT Re-Evaluation 11/11/21    PT Start Time 0146    PT Stop Time 0244    PT Time Calculation (min) 58 min    Activity Tolerance Patient tolerated treatment well    Behavior During Therapy [facility] for tasks assessed/performed             Past Medical History:  Diagnosis Date   Anxiety    Atypical mole 05/20/2003   Right Foot, Dorsum (slight)   Atypical mole 07/20/2003   Right Upper Arm (Minimal)   Atypical mole 07/20/2003   Right Shin (Mild)   Atypical mole 09/24/2012   Right Ant. Thigh (Severe) (widershave)   Atypical mole 10/10/2012   Right Thigh (Mild)   BCC (basal cell carcinoma of skin) 09/24/2002   Upper Left Lip (MOH's)   Condyloma acuminata    COVID 07/2020   Diabetes mellitus without complication ([place]) 67/3419   type 2   Hypertension late 7s   Psoriasis age 63   hair line, pubic, hands   Superficial basal cell carcinoma (BCC) 06/20/2011   Right Side Nose ([place])   Superficial basal cell carcinoma (BCC) 06/02/2014   Right Side Nose   Past Surgical History:  Procedure Laterality Date   ABDOMINAL HYSTERECTOMY  1990   TAH, AUB, ? endometriosis, cervical dysplasia   APPENDECTOMY     done at the time of hysterectomy   [place]   benign BX   CERVICAL DISCECTOMY  1994   C 4-5   INCISION / [place] Right 2001   OPEN REPAIR PERIARTICULAR FRACTURE / DISLOCATION ELBOW Right 2000 for 3 surgeries   secondary to a fall   SHOULDER DEBRIDEMENT Left 07/2009   bone spur   TUBAL LIGATION  1986   Patient Active Problem List   Diagnosis Date Noted   Seasonal allergies 11/26/2018   Gastroesophageal reflux disease 07/29/2018   Diabetes mellitus, type 2  ([place]) 06/11/2012   HLD (hyperlipidemia) 06/11/2012   BP (high blood pressure) 06/11/2012   Hyperlipidemia associated with type 2 diabetes mellitus ([place]) 06/11/2012    REFERRING DIAG: Pain in left hip, LBP.   THERAPY DIAG:  Pain in left hip  Other low back pain  Rationale for Evaluation and Treatment Rehabilitation     SUBJECTIVE: Pleased with progress. PAIN:  Are you having pain? 3/10.  Traveled and slept in another bed the other night.    TODAY'S TREATMENT:     Blank cell = exercise not performed today  Patient prone over two pillows for comfort:  Trigger Point Dry-Needling  Treatment instructions: Expect mild to moderate muscle soreness. S/S of pneumothorax if dry needled over a lung field, and to seek immediate medical attention should they occur. Patient verbalized understanding of these instructions and education.  Patient Consent Given: Yes Education handout provided: Yes Muscles treated: Bil lumbar multifidi Treatment response/outcome: Twitch.  U/S at 1.50 W/CM2 x 12 minutes to patient's bilateral low back. STW/M x 12 minutes to patient's bil lower lumbar musculature and lateral hip musculature.  HMP and IFC at 80-150 Hz at 40% scan x 20 minutes to patient's bilateral LB region.     Clinical  impression. Excellent twitch response with dry needling today in bilateral lumbar multifidi.  Felt good after treatment.  Doing HEP.  PT Long Term Goals - 09/22/21 1602       PT LONG TERM GOAL #1   Title Patient will be independent with her HEP.    Time 4    Period Weeks    Status Achieved.   Target Date 09/22/21      PT LONG TERM GOAL #2   Title Patient will be able to complete her daily activities without her familair hip pain exceeding a 3/10.    Baseline pain is staying between a 1-2/10    Time 4    Period Weeks    Status Achieved    Target Date 09/22/21      PT LONG TERM GOAL #3   Title Patient will report being able to sleep at least 4 hours prior to  waking up due to her left hip pain.    Baseline "I can't tell a big improvement yet." and it is a combination of low back and left hip pain    Time 4    Period Weeks    Status Partially Met.   Target Date 09/22/21      PT LONG TERM GOAL #4   Title Patient will be able to navigate at least 4 steps in a reciprocal pattern without being limited by her left hip pain.    Time 4    Period Weeks    Status Achieved    Target Date 09/22/21      PT LONG TERM GOAL #5   Title Perform ADL's with LBP not exceeding 3-4/10.    Time 4    Period Weeks    Status Partially met.             Plan - 09/28/21 1150     Clinical Impression Statement Overall much improvement.  Instruct in HEP today.   Comorbidities DM, HTN    Examination-Activity Limitations Locomotion Level;Stairs    Examination-Participation Restrictions Cleaning;Community Activity;Yard Work    Stability/Clinical Decision Making Stable/Uncomplicated    Rehab Potential Good    PT Frequency 2x / week    PT Treatment/Interventions D/c next visit.            Syble Picco, [place], PT 10/31/2021, 2:58 PM

## 2021-11-03 ENCOUNTER — Ambulatory Visit: Payer: Medicare HMO | Admitting: Physical Therapy

## 2021-11-09 ENCOUNTER — Ambulatory Visit: Payer: Medicare HMO | Admitting: Physical Therapy

## 2021-11-09 DIAGNOSIS — M25552 Pain in left hip: Secondary | ICD-10-CM

## 2021-11-09 NOTE — Therapy (Signed)
OUTPATIENT PHYSICAL THERAPY TREATMENT NOTE   Patient Name: Jacqueline Cox MRN: [id number] DOB:[birth year], 71 y.o., female Today's Date: 11/09/2021   REFERRING PROVIDER: Rod Can MD   PT End of Session - 11/09/21 1155     Visit Number 18    Number of Visits 18    Date for PT Re-Evaluation 11/11/21    PT Start Time 1117    Activity Tolerance Patient tolerated treatment well              Past Medical History:  Diagnosis Date   Anxiety    Atypical mole 05/20/2003   Right Foot, Dorsum (slight)   Atypical mole 07/20/2003   Right Upper Arm (Minimal)   Atypical mole 07/20/2003   Right Shin (Mild)   Atypical mole 09/24/2012   Right Ant. Thigh (Severe) (wid[place])   Atypical mole 10/10/2012   Right Thigh (Mild)   BCC (basal cell carcinoma of skin) 09/24/2002   Upper Left Lip (MOH's)   Condyloma acuminata    COVID 07/2020   Diabetes mellitus without complication ([place]) 27/5170   type 2   Hypertension late 65s   Psoriasis age 3   hair line, pubic, hands   Superficial basal cell carcinoma (BCC) 06/20/2011   Right Side Nose ([place])   Superficial basal cell carcinoma (BCC) 06/02/2014   Right Side Nose   Past Surgical History:  Procedure Laterality Date   ABDOMINAL HYSTERECTOMY  1990   TAH, AUB, ? endometriosis, cervical dysplasia   APPENDECTOMY     done at the time of hysterectomy   [place]   benign BX   CERVICAL DISCECTOMY  1994   C 4-5   INCISION / [place] Right 2001   OPEN REPAIR PERIARTICULAR FRACTURE / DISLOCATION ELBOW Right 2000 for 3 surgeries   secondary to a fall   SHOULDER DEBRIDEMENT Left 07/2009   bone spur   TUBAL LIGATION  1986   Patient Active Problem List   Diagnosis Date Noted   Seasonal allergies 11/26/2018   Gastroesophageal reflux disease 07/29/2018   Diabetes mellitus, type 2 ([place]) 06/11/2012   HLD (hyperlipidemia) 06/11/2012   BP (high blood pressure) 06/11/2012   Hyperlipidemia  associated with type 2 diabetes mellitus ([place]) 06/11/2012    REFERRING DIAG: Pain in left hip, LBP.   THERAPY DIAG:  Pain in left hip  Rationale for Evaluation and Treatment Rehabilitation     SUBJECTIVE: Pleased with progress. PAIN:  Are you having pain? 2/10.  Left hip.  TODAY'S TREATMENT:     Blank cell = exercise not performed today  Patient prone over two pillows for comfort:  Trigger Point Dry-Needling  Treatment instructions: Expect mild to moderate muscle soreness. S/S of pneumothorax if dry needled over a lung field, and to seek immediate medical attention should they occur. Patient verbalized understanding of these instructions and education.  Patient Consent Given: Yes Education handout provided: Yes Muscles treated: Left lower lumbar multifidi, left glut med and TFL. Treatment response/outcome: Twitch.  Nustep level 4 x 10 minutes. STW/M x 14 minutes to patient's left lower lumbar musculature and lateral hip musculature.  HMP and IFC at 80-150 Hz at 40% scan x 20 minutes to patient's left hip and LB region.     Clinical impression. Excellent twitch response with dry needling today.  See discharge summary.  PT Long Term Goals - 09/22/21 1602       PT LONG TERM GOAL #1   Title Patient will be  independent with her HEP.    Time 4    Period Weeks    Status Achieved.   Target Date 09/22/21      PT LONG TERM GOAL #2   Title Patient will be able to complete her daily activities without her familair hip pain exceeding a 3/10.    Baseline pain is staying between a 1-2/10    Time 4    Period Weeks    Status Achieved    Target Date 09/22/21      PT LONG TERM GOAL #3   Title Patient will report being able to sleep at least 4 hours prior to waking up due to her left hip pain.    Baseline "I can't tell a big improvement yet." and it is a combination of low back and left hip pain    Time 4    Period Weeks     Achieved.   Target Date 09/22/21      PT LONG  TERM GOAL #4   Title Patient will be able to navigate at least 4 steps in a reciprocal pattern without being limited by her left hip pain.    Time 4    Period Weeks    Status Achieved    Target Date 09/22/21      PT LONG TERM GOAL #5   Title Perform ADL's with LBP not exceeding 3-4/10.    Time 4    Period Weeks    Status Achieved.             Plan - 09/28/21 1150     Clinical Impression Statement Overall much improvement.  Instruct in HEP today.   Comorbidities DM, HTN    Examination-Activity Limitations Locomotion Level;Stairs    Examination-Participation Restrictions Cleaning;Community Activity;Yard Work    Stability/Clinical Decision Making Stable/Uncomplicated    Rehab Potential Good    PT Frequency 2x / week    PT Treatment/Interventions D/c next visit.           PHYSICAL THERAPY DISCHARGE SUMMARY  Visits from Start of Care: 18.  Current functional level related to goals / functional outcomes: All goals met.   Remaining deficits: All goals met.   Education / Equipment: HEP.   Patient agrees to discharge. Patient goals were met. Patient is being discharged due to meeting the stated rehab goals.  Cristine Daw, [place], PT 11/09/2021, 11:55 AM

## 2021-11-12 ENCOUNTER — Emergency Department (HOSPITAL_COMMUNITY)
Admission: EM | Admit: 2021-11-12 | Discharge: 2021-11-12 | Disposition: A | Discharge status: Home / Self Care | Payer: Medicare HMO | Attending: Emergency Medicine | Admitting: Emergency Medicine

## 2021-11-12 ENCOUNTER — Emergency Department (HOSPITAL_COMMUNITY): Payer: Medicare HMO

## 2021-11-12 ENCOUNTER — Encounter (HOSPITAL_COMMUNITY): Payer: Self-pay

## 2021-11-12 DIAGNOSIS — W1809XA Striking against other object with subsequent fall, initial encounter: Secondary | ICD-10-CM | POA: Diagnosis not present

## 2021-11-12 DIAGNOSIS — I10 Essential (primary) hypertension: Secondary | ICD-10-CM | POA: Insufficient documentation

## 2021-11-12 DIAGNOSIS — S0990XA Unspecified injury of head, initial encounter: Secondary | ICD-10-CM | POA: Diagnosis present

## 2021-11-12 DIAGNOSIS — Z79899 Other long term (current) drug therapy: Secondary | ICD-10-CM | POA: Insufficient documentation

## 2021-11-12 NOTE — ED Notes (Incomplete)
Ambulated pt to restroom at this time

## 2021-11-12 NOTE — ED Notes (Signed)
Patient states a retractable dog leash popped her in the head last night and today she just hasn't felt herself today.  Patient c/o of headache

## 2021-11-12 NOTE — ED Triage Notes (Signed)
Pt states she was hit in the head last night by a retractable leash- dog got rambunctous. Pt states she fell to the ground face first, no LOC. Pt c/o pain on top of head. Pt states she feels fullness in her head.  No blood thinners.

## 2021-11-12 NOTE — ED Notes (Signed)
Patient transported to CT 

## 2021-11-12 NOTE — Discharge Instructions (Signed)
Take Tylenol or Motrin for pain and follow-up with your doctor for any problems

## 2021-11-12 NOTE — ED Provider Notes (Signed)
[facility] EMERGENCY DEPARTMENT Provider Note   CSN: [id number] Arrival date & time: 11/12/21  1739     History {Add pertinent medical, surgical, social history, OB history to HPI:1} Chief Complaint  Patient presents with   Head Injury    Jacqueline Cox is a 71 y.o. female.  Patient was accidentally hit with a hard object that was attached to a dog leash.  She was hit in the head and then fell back and hit her head.  No loss of consciousness.  Patient has a history of hypertension   Head Injury      Home Medications Prior to Admission medications   Medication Sig Start Date End Date Taking? Authorizing Provider  albuterol (PROVENTIL HFA;VENTOLIN HFA) 108 (90 Base) MCG/ACT inhaler as needed.    [provider]  amLODipine (NORVASC) 5 MG tablet Take 5 mg by mouth daily.    [provider]  atorvastatin (LIPITOR) 40 MG tablet Take 40 mg by mouth daily. 05/08/19   [provider]  busPIRone (BUSPAR) 15 MG tablet  04/21/19   [provider]  cetirizine (ZYRTEC) 10 MG tablet Take 10 mg by mouth daily.    [provider]  cholecalciferol (VITAMIN D) 1000 UNITS tablet Take 2,000 Units by mouth daily.     [provider]  estradiol (ESTRACE) 0.1 MG/GM vaginal cream Use 1/2 g vaginally  and a pea size amount to the urethra two or three times per week as needed to maintain symptom relief. 10/07/21   Nunzio Cobbs, MD  lisinopril-hydrochlorothiazide (ZESTORETIC) 20-12.5 MG tablet Take 1 tablet by mouth daily. 04/09/19   [provider]  Multiple Vitamin (MULTIVITAMIN) capsule Take 1 capsule by mouth daily.    [provider]  omeprazole (PRILOSEC) 20 MG capsule Take 20 mg by mouth daily.    [provider]  SitaGLIPtin-MetFORMIN HCl (JANUMET XR) [phone] MG TB24 Take by mouth. 04/26/19   [provider]  triamcinolone (KENALOG) 0.025 % ointment Apply 1 Application topically 2 (two) times  daily. Use for 2 weeks at a time as needed. 10/07/21   Nunzio Cobbs, MD      Allergies    Codeine, Paxil [paroxetine hcl], and Sulfamethoxazole-trimethoprim    Review of Systems   Review of Systems  Physical Exam Updated Vital Signs BP (!) 191/76 (BP Location: Right Arm)   Pulse 81   Temp 98.1 F (36.7 C) (Oral)   Resp 16   Ht 5' 4.5" (1.638 m)   Wt 66.7 kg   LMP 07/25/1988   SpO2 100%   BMI 24.84 kg/m  Physical Exam  ED Results / Procedures / Treatments   Labs (all labs ordered are listed, but only abnormal results are displayed) Labs Reviewed - No data to display  EKG None  Radiology CT Head Wo Contrast  Result Date: 11/12/2021 CLINICAL DATA:  Head injury yesterday, fall, pain EXAM: CT HEAD WITHOUT CONTRAST CT CERVICAL SPINE WITHOUT CONTRAST TECHNIQUE: Multidetector CT imaging of the head and cervical spine was performed following the standard protocol without intravenous contrast. Multiplanar CT image reconstructions of the cervical spine were also generated. RADIATION DOSE REDUCTION: This exam was performed according to the departmental dose-optimization program which includes automated exposure control, adjustment of the mA and/or kV according to patient size and/or use of iterative reconstruction technique. COMPARISON:  None Available. FINDINGS: CT HEAD FINDINGS Brain: No evidence of acute infarction, hemorrhage, hydrocephalus, extra-axial collection or mass lesion/mass effect. Vascular:  No hyperdense vessel or unexpected calcification. Skull: Normal. Negative for fracture or focal lesion. Sinuses/Orbits: No acute finding. Other: None. CT CERVICAL SPINE FINDINGS Alignment: Normal. Skull base and vertebrae: No acute fracture. No primary bone lesion or focal pathologic process. Soft tissues and spinal canal: No prevertebral fluid or swelling. No visible canal hematoma. Disc levels: Mild to moderate multilevel disc space height loss and osteophytosis throughout the  cervical spine. Upper chest: Negative. Other: None. IMPRESSION: 1. No acute intracranial pathology. 2. No fracture or static subluxation of the cervical spine. 3. Mild to moderate multilevel degenerative disease of the thoracic spine. Electronically Signed   By: Delanna Ahmadi M.D.   On: 11/12/2021 18:58   CT Cervical Spine Wo Contrast  Result Date: 11/12/2021 CLINICAL DATA:  Head injury yesterday, fall, pain EXAM: CT HEAD WITHOUT CONTRAST CT CERVICAL SPINE WITHOUT CONTRAST TECHNIQUE: Multidetector CT imaging of the head and cervical spine was performed following the standard protocol without intravenous contrast. Multiplanar CT image reconstructions of the cervical spine were also generated. RADIATION DOSE REDUCTION: This exam was performed according to the departmental dose-optimization program which includes automated exposure control, adjustment of the mA and/or kV according to patient size and/or use of iterative reconstruction technique. COMPARISON:  None Available. FINDINGS: CT HEAD FINDINGS Brain: No evidence of acute infarction, hemorrhage, hydrocephalus, extra-axial collection or mass lesion/mass effect. Vascular: No hyperdense vessel or unexpected calcification. Skull: Normal. Negative for fracture or focal lesion. Sinuses/Orbits: No acute finding. Other: None. CT CERVICAL SPINE FINDINGS Alignment: Normal. Skull base and vertebrae: No acute fracture. No primary bone lesion or focal pathologic process. Soft tissues and spinal canal: No prevertebral fluid or swelling. No visible canal hematoma. Disc levels: Mild to moderate multilevel disc space height loss and osteophytosis throughout the cervical spine. Upper chest: Negative. Other: None. IMPRESSION: 1. No acute intracranial pathology. 2. No fracture or static subluxation of the cervical spine. 3. Mild to moderate multilevel degenerative disease of the thoracic spine. Electronically Signed   By: Delanna Ahmadi M.D.   On: 11/12/2021 18:58     Procedures Procedures  {Document cardiac monitor, telemetry assessment procedure when appropriate:1}  Medications Ordered in ED Medications - No data to display  ED Course/ Medical Decision Making/ A&P                           Medical Decision Making Amount and/or Complexity of Data Reviewed Radiology: ordered.   Patient with minor head injury.  She has normal CT head and cervical spine.  She will be discharged home and will take Motrin for pain  {Document critical care time when appropriate:1} {Document review of labs and clinical decision tools ie heart score, Chads2Vasc2 etc:1}  {Document your independent review of radiology images, and any outside records:1} {Document your discussion with family members, caretakers, and with consultants:1} {Document social determinants of health affecting pt's care:1} {Document your decision making why or why not admission, treatments were needed:1} Final Clinical Impression(s) / ED Diagnoses Final diagnoses:  Injury of head, initial encounter    Rx / DC Orders ED Discharge Orders     None

## 2021-12-20 ENCOUNTER — Ambulatory Visit: Payer: Medicare HMO | Admitting: Physician Assistant

## 2021-12-27 ENCOUNTER — Ambulatory Visit: Payer: Medicare HMO | Admitting: Physician Assistant

## 2024-01-22 ENCOUNTER — Ambulatory Visit: Admitting: Obstetrics and Gynecology

## 2024-01-28 ENCOUNTER — Other Ambulatory Visit (HOSPITAL_BASED_OUTPATIENT_CLINIC_OR_DEPARTMENT_OTHER): Payer: Self-pay

## 2024-01-28 MED ORDER — METHYLPREDNISOLONE 4 MG PO TBPK
ORAL_TABLET | ORAL | 0 refills | Status: AC
  Filled 2024-01-28: qty 21, 6d supply, fill #0

## 2024-02-19 ENCOUNTER — Other Ambulatory Visit (HOSPITAL_BASED_OUTPATIENT_CLINIC_OR_DEPARTMENT_OTHER): Payer: Self-pay

## 2024-02-19 MED ORDER — TERBINAFINE HCL 250 MG PO TABS
250.0000 mg | ORAL_TABLET | Freq: Every day | ORAL | 0 refills | Status: AC
  Filled 2024-02-19: qty 42, 42d supply, fill #0

## 2024-04-04 ENCOUNTER — Other Ambulatory Visit (HOSPITAL_BASED_OUTPATIENT_CLINIC_OR_DEPARTMENT_OTHER): Payer: Self-pay

## 2024-04-04 MED ORDER — EZETIMIBE 10 MG PO TABS
10.0000 mg | ORAL_TABLET | Freq: Every day | ORAL | 5 refills | Status: AC
  Filled 2024-04-04: qty 30, 30d supply, fill #0

## 2024-04-07 ENCOUNTER — Other Ambulatory Visit: Payer: Self-pay

## 2024-04-07 ENCOUNTER — Other Ambulatory Visit (HOSPITAL_BASED_OUTPATIENT_CLINIC_OR_DEPARTMENT_OTHER): Payer: Self-pay

## 2024-04-07 MED ORDER — ACCU-CHEK GUIDE TEST VI STRP
ORAL_STRIP | 12 refills | Status: AC
  Filled 2024-04-07: qty 100, 25d supply, fill #0

## 2024-04-07 MED ORDER — ACCU-CHEK SOFTCLIX LANCETS MISC
12 refills | Status: AC
  Filled 2024-04-07: qty 100, 25d supply, fill #0

## 2024-04-07 MED ORDER — ACCU-CHEK GUIDE W/DEVICE KIT
PACK | 0 refills | Status: AC
  Filled 2024-04-07: qty 1, 30d supply, fill #0
